# Patient Record
Sex: Female | Born: 1970 | Race: White | Hispanic: No | Marital: Married | Smoking: Never smoker
Health system: Southern US, Community
[De-identification: ages and names within clinical notes are randomized; demographics above are authoritative.]

## PROBLEM LIST (undated history)

## (undated) DIAGNOSIS — F32A Depression, unspecified: Secondary | ICD-10-CM

## (undated) DIAGNOSIS — F329 Major depressive disorder, single episode, unspecified: Secondary | ICD-10-CM

## (undated) DIAGNOSIS — N809 Endometriosis, unspecified: Secondary | ICD-10-CM

## (undated) DIAGNOSIS — Z8489 Family history of other specified conditions: Secondary | ICD-10-CM

## (undated) HISTORY — PX: BUNIONECTOMY: SHX129

## (undated) HISTORY — PX: APPENDECTOMY: SHX54

---

## 1998-05-15 ENCOUNTER — Other Ambulatory Visit: Admission: RE | Admit: 1998-05-15 | Discharge: 1998-05-15 | Payer: Self-pay | Admitting: Obstetrics and Gynecology

## 1999-09-24 DIAGNOSIS — N809 Endometriosis, unspecified: Secondary | ICD-10-CM

## 1999-09-24 HISTORY — DX: Endometriosis, unspecified: N80.9

## 1999-09-24 HISTORY — PX: PELVIC LAPAROSCOPY: SHX162

## 2008-08-24 ENCOUNTER — Ambulatory Visit: Payer: Self-pay | Admitting: Obstetrics and Gynecology

## 2009-04-13 ENCOUNTER — Ambulatory Visit: Payer: Self-pay

## 2009-04-13 ENCOUNTER — Ambulatory Visit: Payer: Self-pay | Admitting: Internal Medicine

## 2009-05-18 ENCOUNTER — Ambulatory Visit: Payer: Self-pay | Admitting: Internal Medicine

## 2009-11-18 IMAGING — CT CT CHEST W/ CM
2 series · 16 of 31 positions shown, 20 images · IV contrast (APPLIED)
Comparison: none

REASON FOR EXAM: unspecified chest pain and pressure
COMMENTS:

[Series 4: soft tissue · axial · 0.69mm/px · z∈[-730,-640]mm · 3 of 98 slices shown]
[im 8/98  mediastinal]
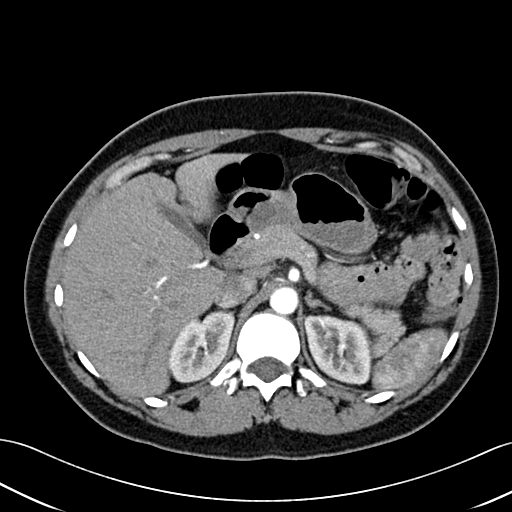
[im 23/98  mediastinal]
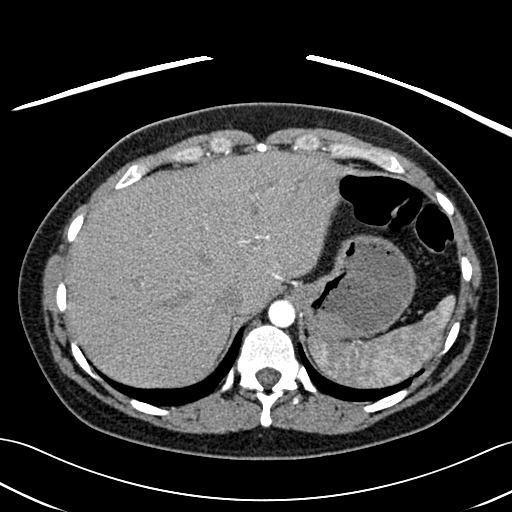
[im 38/98  mediastinal]
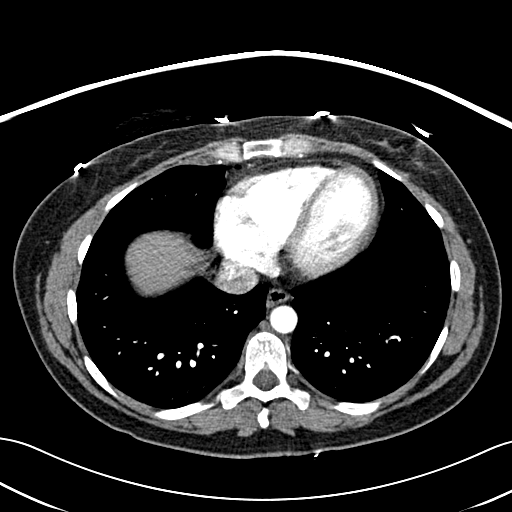

[Series 5: lung windows · axial · 0.69mm/px · z∈[-720,-484]mm · 13 of 95 slices shown, 17 images]
[im 8/95  mediastinal]
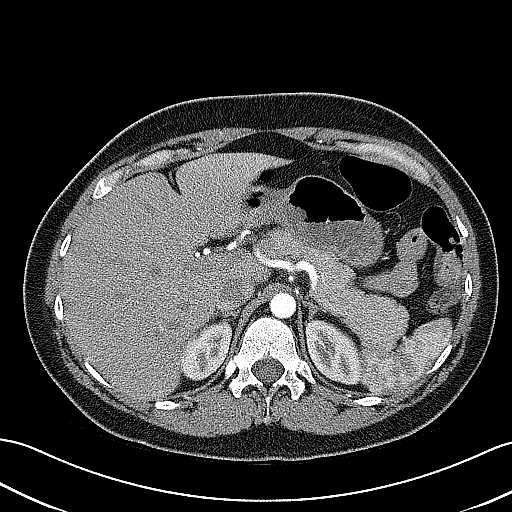
[im 8/95  lung]
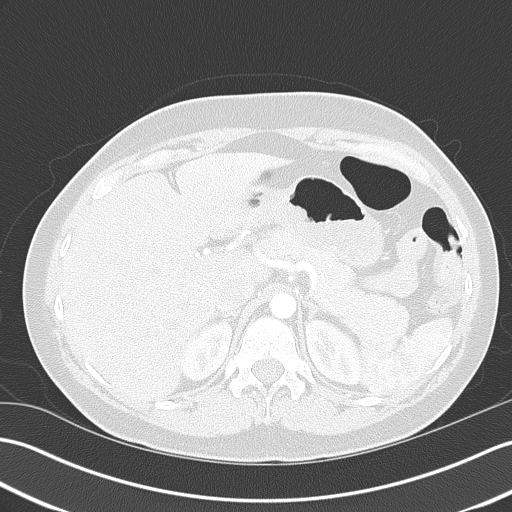
[im 15/95  lung]
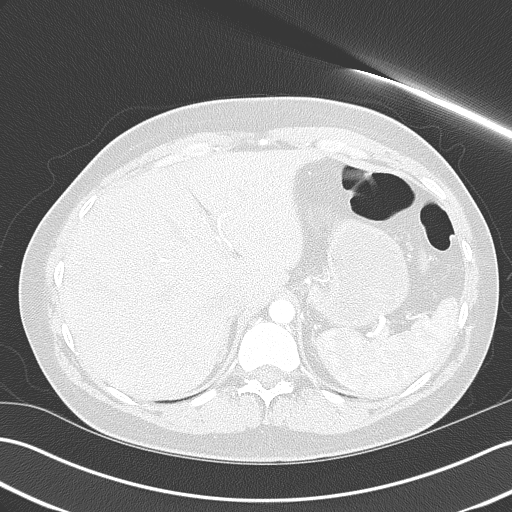
[im 22/95  lung]
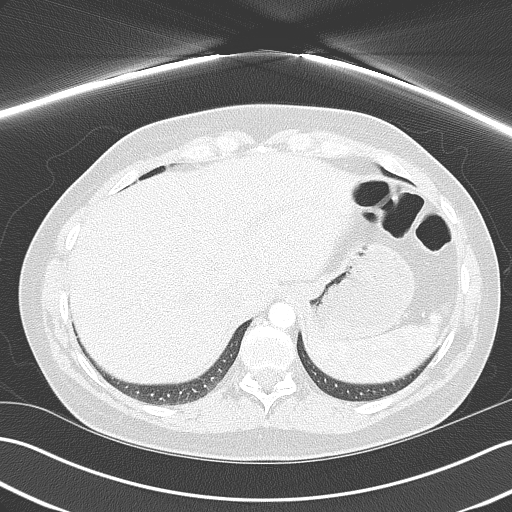
[im 29/95  lung]
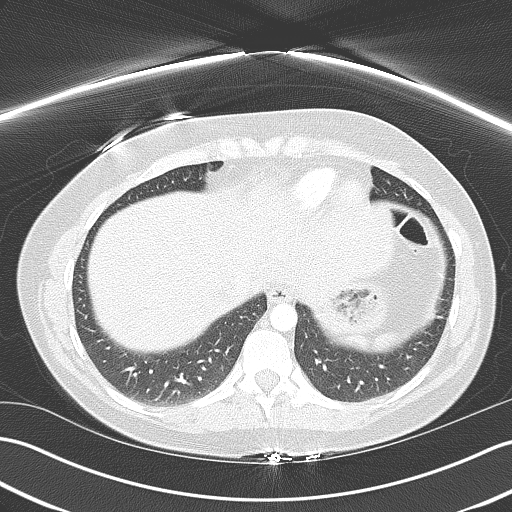
[im 37/95  mediastinal]
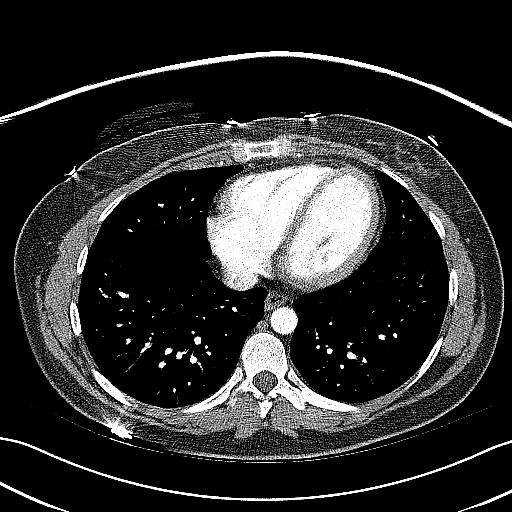
[im 37/95  lung]
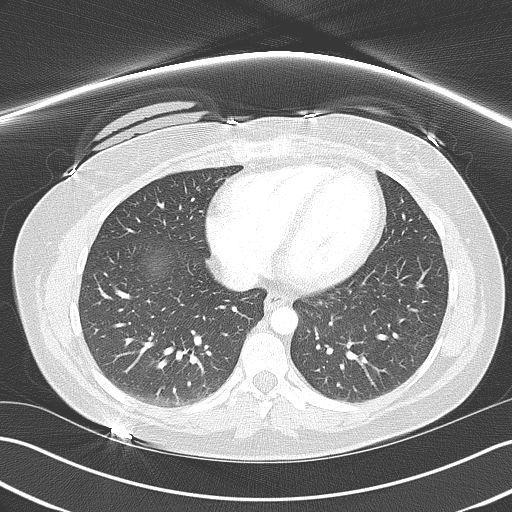
[im 44/95  lung]
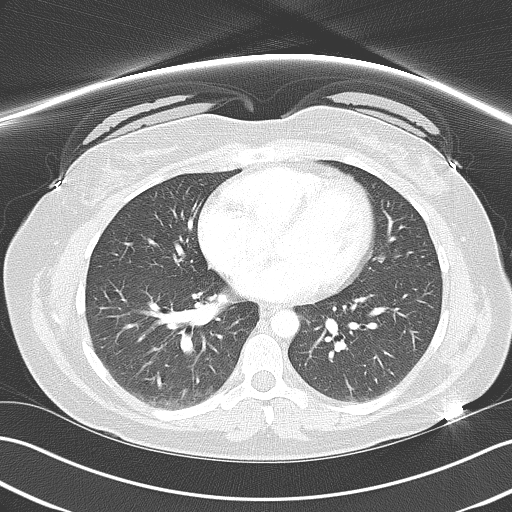
[im 48/95  lung]
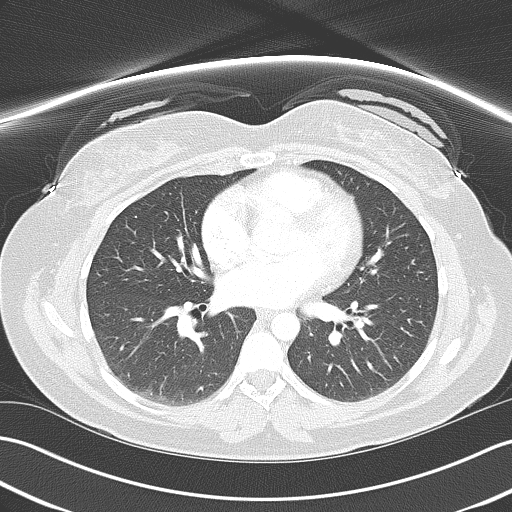
[im 51/95  lung]
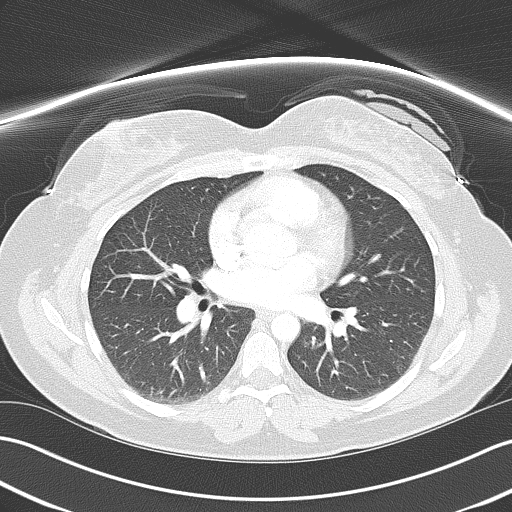
[im 58/95  mediastinal]
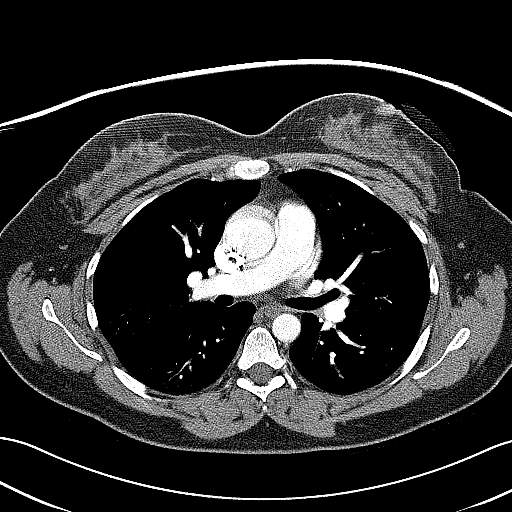
[im 58/95  lung]
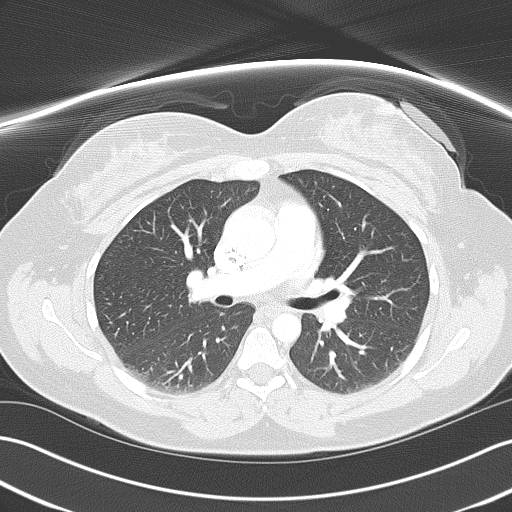
[im 66/95  lung]
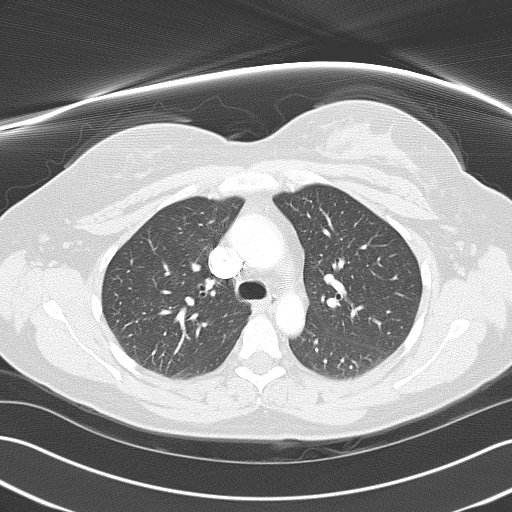
[im 73/95  lung]
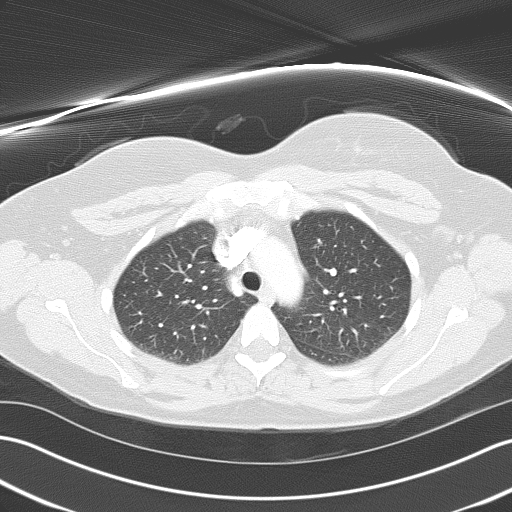
[im 80/95  lung]
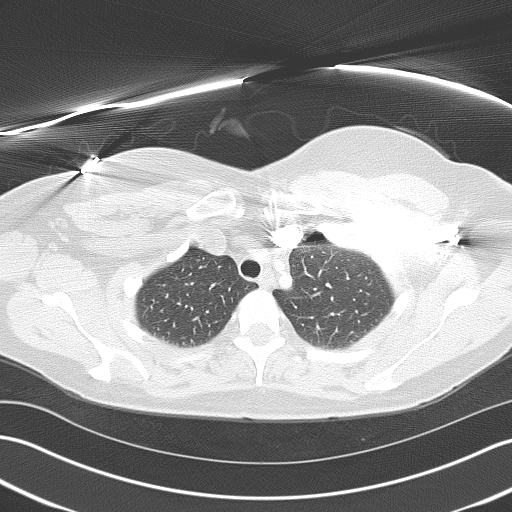
[im 87/95  mediastinal]
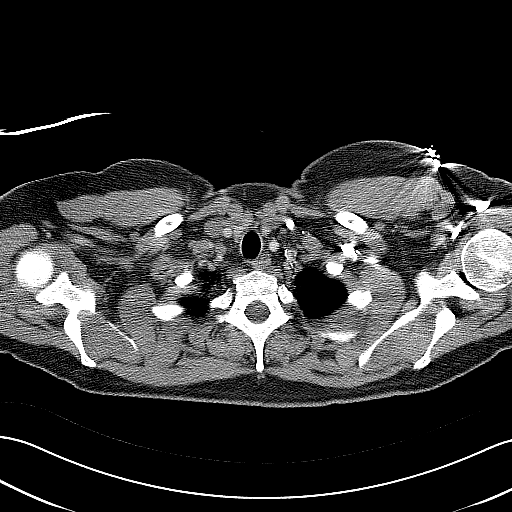
[im 87/95  lung]
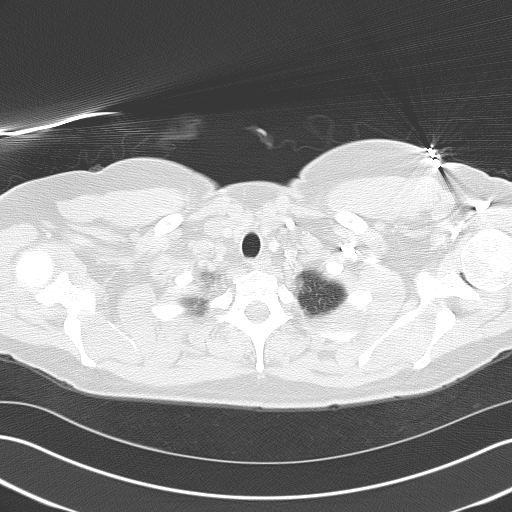

[16 of 31 positions shown; findings below may reference images not displayed]

PROCEDURE:     CT  - CT CHEST (FOR PE) W  - May 18, 2009  [DATE]

RESULT:     History: Pain.

Comparison studies: A prior Doppler exam of the lower extremities of
04/13/2009.

Procedure and Findings: IV contrast enhanced CT of the chest performed using
75 cc of Dsovue-NPO. Thoracic aorta is normal. Motion artifact of the
pulmonary arteries and aorta noted. Adrenals are normal. Pulmonary arteries
are normal. Volume averaging with fat noted along the inferior margin of the
pulmonary artery. Large airways are patent. No focal pulmonary abnormalities
identified. Webspaces utilized for evaluation of the pulmonary arteries and
aorta.
IMPRESSION: No evidence of pulmonary embolus.

## 2013-09-29 ENCOUNTER — Ambulatory Visit: Payer: Self-pay | Admitting: Obstetrics and Gynecology

## 2013-10-08 ENCOUNTER — Ambulatory Visit: Payer: Self-pay | Admitting: Obstetrics and Gynecology

## 2014-10-21 ENCOUNTER — Ambulatory Visit: Payer: Self-pay | Admitting: Obstetrics and Gynecology

## 2014-11-18 ENCOUNTER — Ambulatory Visit: Payer: Self-pay | Admitting: Neurology

## 2015-03-22 ENCOUNTER — Other Ambulatory Visit: Payer: Self-pay | Admitting: Unknown Physician Specialty

## 2015-03-22 DIAGNOSIS — M25562 Pain in left knee: Principal | ICD-10-CM

## 2015-03-22 DIAGNOSIS — G8929 Other chronic pain: Secondary | ICD-10-CM

## 2015-03-24 ENCOUNTER — Ambulatory Visit: Payer: BC Managed Care – PPO

## 2015-03-24 ENCOUNTER — Ambulatory Visit
Admission: RE | Admit: 2015-03-24 | Discharge: 2015-03-24 | Disposition: A | Discharge status: Home / Self Care | Payer: BC Managed Care – PPO | Source: Ambulatory Visit | Attending: Unknown Physician Specialty | Admitting: Unknown Physician Specialty

## 2015-03-24 DIAGNOSIS — M25562 Pain in left knee: Secondary | ICD-10-CM

## 2015-03-24 DIAGNOSIS — S83242A Other tear of medial meniscus, current injury, left knee, initial encounter: Secondary | ICD-10-CM | POA: Diagnosis not present

## 2015-03-24 DIAGNOSIS — G8929 Other chronic pain: Secondary | ICD-10-CM | POA: Diagnosis present

## 2015-04-04 ENCOUNTER — Ambulatory Visit
Admission: RE | Admit: 2015-04-04 | Discharge: 2015-04-04 | Disposition: A | Discharge status: Home / Self Care | Payer: BC Managed Care – PPO | Source: Ambulatory Visit | Attending: Surgery | Admitting: Surgery

## 2015-04-04 ENCOUNTER — Encounter
Admission: RE | Disposition: A | Discharge status: Home / Self Care | Payer: Self-pay | Source: Ambulatory Visit | Attending: Surgery

## 2015-04-04 ENCOUNTER — Ambulatory Visit: Payer: BC Managed Care – PPO | Admitting: Certified Registered Nurse Anesthetist

## 2015-04-04 ENCOUNTER — Encounter: Payer: Self-pay | Admitting: *Deleted

## 2015-04-04 DIAGNOSIS — Z800 Family history of malignant neoplasm of digestive organs: Secondary | ICD-10-CM | POA: Diagnosis not present

## 2015-04-04 DIAGNOSIS — Z885 Allergy status to narcotic agent status: Secondary | ICD-10-CM | POA: Insufficient documentation

## 2015-04-04 DIAGNOSIS — Z79899 Other long term (current) drug therapy: Secondary | ICD-10-CM | POA: Diagnosis not present

## 2015-04-04 DIAGNOSIS — Z823 Family history of stroke: Secondary | ICD-10-CM | POA: Insufficient documentation

## 2015-04-04 DIAGNOSIS — G43909 Migraine, unspecified, not intractable, without status migrainosus: Secondary | ICD-10-CM | POA: Insufficient documentation

## 2015-04-04 DIAGNOSIS — Z801 Family history of malignant neoplasm of trachea, bronchus and lung: Secondary | ICD-10-CM | POA: Insufficient documentation

## 2015-04-04 DIAGNOSIS — M23222 Derangement of posterior horn of medial meniscus due to old tear or injury, left knee: Secondary | ICD-10-CM | POA: Diagnosis present

## 2015-04-04 DIAGNOSIS — K589 Irritable bowel syndrome without diarrhea: Secondary | ICD-10-CM | POA: Insufficient documentation

## 2015-04-04 DIAGNOSIS — Z8249 Family history of ischemic heart disease and other diseases of the circulatory system: Secondary | ICD-10-CM | POA: Diagnosis not present

## 2015-04-04 DIAGNOSIS — Z803 Family history of malignant neoplasm of breast: Secondary | ICD-10-CM | POA: Diagnosis not present

## 2015-04-04 HISTORY — DX: Major depressive disorder, single episode, unspecified: F32.9

## 2015-04-04 HISTORY — PX: KNEE ARTHROSCOPY WITH MENISCAL REPAIR: SHX5653

## 2015-04-04 HISTORY — DX: Depression, unspecified: F32.A

## 2015-04-04 LAB — POCT PREGNANCY, URINE: Preg Test, Ur: NEGATIVE

## 2015-04-04 SURGERY — ARTHROSCOPY, KNEE, WITH MENISCUS REPAIR
Anesthesia: General | Laterality: Left | Wound class: Clean

## 2015-04-04 MED ORDER — POTASSIUM CHLORIDE IN NACL 20-0.9 MEQ/L-% IV SOLN
INTRAVENOUS | Status: DC
  Filled 2015-04-04 (×3): qty 1000

## 2015-04-04 MED ORDER — MIDAZOLAM HCL 2 MG/2ML IJ SOLN
INTRAMUSCULAR | Status: DC | PRN
  Administered 2015-04-04: 2 mg via INTRAVENOUS

## 2015-04-04 MED ORDER — DEXAMETHASONE SODIUM PHOSPHATE 4 MG/ML IJ SOLN
INTRAMUSCULAR | Status: DC | PRN
  Administered 2015-04-04: 4 mg via INTRAVENOUS

## 2015-04-04 MED ORDER — LIDOCAINE HCL (CARDIAC) 20 MG/ML IV SOLN
INTRAVENOUS | Status: DC | PRN
  Administered 2015-04-04: 80 mg via INTRAVENOUS

## 2015-04-04 MED ORDER — OXYCODONE HCL 5 MG PO TABS: 5.0000 mg | ORAL_TABLET | ORAL | Status: DC | PRN

## 2015-04-04 MED ORDER — KETOROLAC TROMETHAMINE 30 MG/ML IJ SOLN
INTRAMUSCULAR | Status: DC | PRN
  Administered 2015-04-04: 30 mg via INTRAVENOUS

## 2015-04-04 MED ORDER — CEFAZOLIN SODIUM-DEXTROSE 2-3 GM-% IV SOLR
2.0000 g | Freq: Once | INTRAVENOUS | Status: AC
  Administered 2015-04-04: 2 g via INTRAVENOUS

## 2015-04-04 MED ORDER — METOCLOPRAMIDE HCL 5 MG/ML IJ SOLN: 5.0000 mg | Freq: Three times a day (TID) | INTRAMUSCULAR | Status: DC | PRN

## 2015-04-04 MED ORDER — LIDOCAINE HCL 1 % IJ SOLN
INTRAMUSCULAR | Status: DC | PRN
  Administered 2015-04-04: 30 mL

## 2015-04-04 MED ORDER — BUPIVACAINE-EPINEPHRINE (PF) 0.5% -1:200000 IJ SOLN
INTRAMUSCULAR | Status: AC
  Filled 2015-04-04: qty 30

## 2015-04-04 MED ORDER — ONDANSETRON HCL 4 MG/2ML IJ SOLN: 4.0000 mg | Freq: Once | INTRAMUSCULAR | Status: DC | PRN

## 2015-04-04 MED ORDER — LACTATED RINGERS IV SOLN
INTRAVENOUS | Status: DC
  Administered 2015-04-04: 07:00:00 via INTRAVENOUS

## 2015-04-04 MED ORDER — ONDANSETRON HCL 4 MG PO TABS: 4.0000 mg | ORAL_TABLET | Freq: Four times a day (QID) | ORAL | Status: DC | PRN

## 2015-04-04 MED ORDER — ACETAMINOPHEN 10 MG/ML IV SOLN
INTRAVENOUS | Status: AC
  Filled 2015-04-04: qty 100

## 2015-04-04 MED ORDER — ONDANSETRON HCL 4 MG/2ML IJ SOLN: 4.0000 mg | Freq: Four times a day (QID) | INTRAMUSCULAR | Status: DC | PRN

## 2015-04-04 MED ORDER — PROPOFOL 10 MG/ML IV BOLUS
INTRAVENOUS | Status: DC | PRN
  Administered 2015-04-04: 200 mg via INTRAVENOUS

## 2015-04-04 MED ORDER — METOCLOPRAMIDE HCL 10 MG PO TABS: 5.0000 mg | ORAL_TABLET | Freq: Three times a day (TID) | ORAL | Status: DC | PRN

## 2015-04-04 MED ORDER — ACETAMINOPHEN 10 MG/ML IV SOLN
INTRAVENOUS | Status: DC | PRN
  Administered 2015-04-04: 1000 mg via INTRAVENOUS

## 2015-04-04 MED ORDER — BUPIVACAINE-EPINEPHRINE (PF) 0.5% -1:200000 IJ SOLN
INTRAMUSCULAR | Status: DC | PRN
  Administered 2015-04-04: 30 mL via PERINEURAL
  Administered 2015-04-04: 10 mL via PERINEURAL

## 2015-04-04 MED ORDER — CEFAZOLIN SODIUM-DEXTROSE 2-3 GM-% IV SOLR
INTRAVENOUS | Status: AC
  Filled 2015-04-04: qty 50

## 2015-04-04 MED ORDER — FENTANYL CITRATE (PF) 100 MCG/2ML IJ SOLN: 25.0000 ug | INTRAMUSCULAR | Status: DC | PRN

## 2015-04-04 MED ORDER — LIDOCAINE HCL (PF) 1 % IJ SOLN
INTRAMUSCULAR | Status: AC
  Filled 2015-04-04: qty 30

## 2015-04-04 MED ORDER — ONDANSETRON HCL 4 MG/2ML IJ SOLN
INTRAMUSCULAR | Status: DC | PRN
  Administered 2015-04-04: 4 mg via INTRAVENOUS

## 2015-04-04 MED ORDER — FENTANYL CITRATE (PF) 100 MCG/2ML IJ SOLN
INTRAMUSCULAR | Status: DC | PRN
  Administered 2015-04-04 (×4): 25 ug via INTRAVENOUS

## 2015-04-04 MED ORDER — ONDANSETRON 4 MG PO TBDP: 4.0000 mg | ORAL_TABLET | Freq: Three times a day (TID) | ORAL | Status: DC | PRN

## 2015-04-04 SURGICAL SUPPLY — 32 items
BAG COUNTER SPONGE EZ (MISCELLANEOUS) IMPLANT
BAG SPNG 4X4 CLR HAZ (MISCELLANEOUS)
BANDAGE ELASTIC 6 CLIP ST LF (GAUZE/BANDAGES/DRESSINGS) ×3 IMPLANT
BLADE FULL RADIUS 3.5 (BLADE) ×3 IMPLANT
BLADE SHAVER 4.5X7 STR FR (MISCELLANEOUS) ×3 IMPLANT
CHLORAPREP W/TINT 26ML (MISCELLANEOUS) ×3 IMPLANT
COUNTER SPONGE BAG EZ (MISCELLANEOUS)
DECANTER SPIKE VIAL GLASS SM (MISCELLANEOUS) ×4 IMPLANT
DRAPE SHEET LG 3/4 BI-LAMINATE (DRAPES) ×2 IMPLANT
GAUZE SPONGE 4X4 12PLY STRL (GAUZE/BANDAGES/DRESSINGS) ×3 IMPLANT
GLOVE BIO SURGEON STRL SZ8 (GLOVE) ×6 IMPLANT
GLOVE BIOGEL M 7.0 STRL (GLOVE) ×6 IMPLANT
GLOVE BIOGEL PI IND STRL 7.5 (GLOVE) ×1 IMPLANT
GLOVE BIOGEL PI INDICATOR 7.5 (GLOVE) ×2
GLOVE INDICATOR 8.0 STRL GRN (GLOVE) ×3 IMPLANT
GOWN STRL REUS W/ TWL LRG LVL3 (GOWN DISPOSABLE) ×1 IMPLANT
GOWN STRL REUS W/ TWL XL LVL3 (GOWN DISPOSABLE) ×2 IMPLANT
GOWN STRL REUS W/TWL LRG LVL3 (GOWN DISPOSABLE) ×3
GOWN STRL REUS W/TWL XL LVL3 (GOWN DISPOSABLE) ×6
IV LACTATED RINGER IRRG 3000ML (IV SOLUTION) ×3
IV LR IRRIG 3000ML ARTHROMATIC (IV SOLUTION) ×1 IMPLANT
KIT RM TURNOVER STRD PROC AR (KITS) ×3 IMPLANT
MANIFOLD NEPTUNE II (INSTRUMENTS) ×3 IMPLANT
NDL HYPO 21X1.5 SAFETY (NEEDLE) ×1 IMPLANT
NEEDLE HYPO 21X1.5 SAFETY (NEEDLE) ×3 IMPLANT
PACK ARTHROSCOPY KNEE (MISCELLANEOUS) ×3 IMPLANT
PAD GROUND ADULT SPLIT (MISCELLANEOUS) ×3 IMPLANT
PENCIL ELECTRO HAND CTR (MISCELLANEOUS) ×3 IMPLANT
SUT PROLENE 4 0 PS 2 18 (SUTURE) ×3 IMPLANT
SYR 50ML LL SCALE MARK (SYRINGE) ×1 IMPLANT
TUBING ARTHRO INFLOW-ONLY STRL (TUBING) ×3 IMPLANT
WAND HAND CNTRL MULTIVAC 90 (MISCELLANEOUS) ×3 IMPLANT

## 2015-04-04 NOTE — H&P (Signed)
Paper H&P to be scanned into permanent record. H&P reviewed. No changes. 

## 2015-04-04 NOTE — Anesthesia Postprocedure Evaluation (Signed)
  Anesthesia Post-op Note  Patient: Samantha Cunningham  Procedure(s) Performed: Procedure(s): KNEE ARTHROSCOPY WITH partial medial menisectomy (Left)  Anesthesia type:General  Patient location: PACU  Post pain: Pain level controlled  Post assessment: Post-op Vital signs reviewed, Patient's Cardiovascular Status Stable, Respiratory Function Stable, Patent Airway and No signs of Nausea or vomiting  Post vital signs: Reviewed and stable  Last Vitals:  Filed Vitals:   04/04/15 0946  BP: 127/77  Pulse: 77  Temp: 36.4 C  Resp: 16    Level of consciousness: awake, alert  and patient cooperative  Complications: No apparent anesthesia complications

## 2015-04-04 NOTE — Anesthesia Preprocedure Evaluation (Addendum)
Anesthesia Evaluation   Patient awake    Reviewed: Allergy & Precautions, NPO status , Patient's Chart, lab work & pertinent test results  Airway Mallampati: II       Dental  (+) Chipped   Pulmonary neg pulmonary ROS,    Pulmonary exam normal       Cardiovascular negative cardio ROS Normal cardiovascular exam    Neuro/Psych Depression negative neurological ROS     GI/Hepatic negative GI ROS, Neg liver ROS,   Endo/Other  negative endocrine ROS  Renal/GU negative Renal ROS  negative genitourinary   Musculoskeletal Knee derangement   Abdominal Normal abdominal exam  (+) + obese,   Peds negative pediatric ROS (+)  Hematology negative hematology ROS (+)   Anesthesia Other Findings Patient has issues with Fentanyl patch  Reproductive/Obstetrics                            Anesthesia Physical Anesthesia Plan  ASA: II  Anesthesia Plan: General   Post-op Pain Management:    Induction: Intravenous  Airway Management Planned: LMA  Additional Equipment:   Intra-op Plan:   Post-operative Plan:   Informed Consent: I have reviewed the patients History and Physical, chart, labs and discussed the procedure including the risks, benefits and alternatives for the proposed anesthesia with the patient or authorized representative who has indicated his/her understanding and acceptance.   Dental advisory given  Plan Discussed with: CRNA and Surgeon  Anesthesia Plan Comments:         Anesthesia Quick Evaluation

## 2015-04-04 NOTE — Transfer of Care (Signed)
Immediate Anesthesia Transfer of Care Note  Patient: Samantha Cunningham  Procedure(s) Performed: Procedure(s): KNEE ARTHROSCOPY WITH partial medial menisectomy (Left)  Patient Location: PACU  Anesthesia Type:General  Level of Consciousness: awake, alert  and oriented  Airway & Oxygen Therapy: Patient Spontanous Breathing and Patient connected to face mask oxygen  Post-op Assessment: Report given to RN and Post -op Vital signs reviewed and stable  Post vital signs: stable  Last Vitals:  Filed Vitals:   04/04/15 0846  BP: 129/86  Pulse: 95  Temp: 36.9 C  Resp: 13    Complications: No apparent anesthesia complications

## 2015-04-04 NOTE — Discharge Instructions (Addendum)
Keep dressing dry and intact.  May shower after dressing changed on post-op day #4 (Saturday).  Cover staples/sutures with Band-Aids after drying off. Apply ice frequently to knee. May weight-bear as tolerated - use crutches or walker as needed. Follow-up in 10-14 days or as scheduled  .AMBULATORY SURGERY  DISCHARGE INSTRUCTIONS   1) The drugs that you were given will stay in your system until tomorrow so for the next 24 hours you should not:  A) Drive an automobile B) Make any legal decisions C) Drink any alcoholic beverage   2) You may resume regular meals tomorrow.  Today it is better to start with liquids and gradually work up to solid foods.  You may eat anything you prefer, but it is better to start with liquids, then soup and crackers, and gradually work up to solid foods.   3) Please notify your doctor immediately if you have any unusual bleeding, trouble breathing, redness and pain at the surgery site, drainage, fever, or pain not relieved by medication.    4) Additional Instructions:        Please contact your physician with any problems or Same Day Surgery at [phone], Monday through Friday 6 am to 4 pm, or [facility] Main number at [phone].

## 2015-04-04 NOTE — Op Note (Signed)
04/04/2015  8:40 AM  Patient:   Samantha Cunningham  Pre-Op Diagnosis:   Medial meniscus tear, left knee.  Postoperative diagnosis:   Same  Procedure:   Arthroscopic partial medial meniscectomy, left knee.  Surgeon:   Maryagnes AmosJ. Jeffrey Poggi, M.D.  Anesthesia:   General LMA.  Findings:   As above. The lateral meniscus was in satisfactory condition, as were the anterior and posterior cruciate ligaments. The articular surfaces of the patella, the femur, and the tibia all were in satisfactory condition.  Complications:   None.  EBL:   5 cc.  Total fluids:   700 cc of crystalloid.  Tourniquet time:   None  Drains:   None  Closure:   4-0 Prolene interrupted sutures.  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 Her symptoms have persisted despite medications, activity modification, etc. Her history and examination are consistent with a medial meniscus tear, confirmed by MRI scan. The patient presents at this time for arthroscopy, debridement, and partial medial meniscectomy.  Procedure:   The patient was brought into the operating room and lain in the supine position. After adequate general laryngal mask anesthesia was obtained, a timeout was performed to verify the appropriate side. The patient's left knee was injected sterilely using a solution of 30 cc of 1% lidocaine and 30 cc of 0.5% Sensorcaine with epinephrine. The left lower extremity was prepped with DuraPrep solution before being draped sterilely. Preoperative antibiotics were administered. The expected portal sites were injected with 0.5% Sensorcaine with epinephrine before the camera was placed in the anterolateral portal and instrumentation performed through the anteromedial portal. The knee was sequentially examined beginning in the suprapatellar pouch the progressing to the patellofemoral space, the  medial gutter compartment, the notch, and finally the lateral compartment entered. The findings were as described above. Abundant reactive synovial tissues anteriorly were debrided using the full-radius resector in order to improve visualization. Assessment of the medial meniscus tear showed that it was a complex tear involving the posterior medial portion. Much of the tearing was in the white-white zone and therefore deemed not repairable. The torn portions were debrided back to stable margins using straight and up-biting mini-munchers was as well as the 3.5 mm full-radius resector. Some probing of the remaining rim demonstrated excellent stability. Laterally, the meniscus was intact to probing. Areas of bleeding were cauterized using the ArthroCare wand. The instruments were removed from the joint after suctioning the excess fluid. The portal sites were closed using 4-0 Prolene interrupted sutures before a sterile bulky dressing was applied to the knee. The patient was then awakened, extubated, and returned to the recovery room in satisfactory condition after tolerating the procedure well.

## 2015-07-27 ENCOUNTER — Other Ambulatory Visit: Payer: Self-pay | Admitting: Obstetrics and Gynecology

## 2015-07-27 DIAGNOSIS — N6312 Unspecified lump in the right breast, upper inner quadrant: Secondary | ICD-10-CM

## 2015-08-02 ENCOUNTER — Ambulatory Visit
Admission: RE | Admit: 2015-08-02 | Discharge: 2015-08-02 | Disposition: A | Discharge status: Home / Self Care | Payer: BC Managed Care – PPO | Source: Ambulatory Visit | Attending: Obstetrics and Gynecology | Admitting: Obstetrics and Gynecology

## 2015-08-02 ENCOUNTER — Other Ambulatory Visit: Payer: Self-pay | Admitting: Obstetrics and Gynecology

## 2015-08-02 DIAGNOSIS — N6001 Solitary cyst of right breast: Secondary | ICD-10-CM | POA: Diagnosis not present

## 2015-08-02 DIAGNOSIS — N6312 Unspecified lump in the right breast, upper inner quadrant: Secondary | ICD-10-CM

## 2015-08-02 DIAGNOSIS — N63 Unspecified lump in breast: Secondary | ICD-10-CM | POA: Diagnosis present

## 2015-08-04 ENCOUNTER — Ambulatory Visit: Payer: BC Managed Care – PPO

## 2015-08-04 ENCOUNTER — Other Ambulatory Visit: Payer: BC Managed Care – PPO

## 2015-08-11 ENCOUNTER — Ambulatory Visit: Payer: BC Managed Care – PPO

## 2015-08-11 ENCOUNTER — Other Ambulatory Visit: Payer: BC Managed Care – PPO

## 2016-01-17 ENCOUNTER — Other Ambulatory Visit: Payer: Self-pay | Admitting: Obstetrics and Gynecology

## 2016-01-17 DIAGNOSIS — Z1231 Encounter for screening mammogram for malignant neoplasm of breast: Secondary | ICD-10-CM

## 2016-01-25 ENCOUNTER — Ambulatory Visit
Admission: RE | Admit: 2016-01-25 | Discharge: 2016-01-25 | Disposition: A | Discharge status: Home / Self Care | Payer: BC Managed Care – PPO | Source: Ambulatory Visit | Attending: Obstetrics and Gynecology | Admitting: Obstetrics and Gynecology

## 2016-01-25 DIAGNOSIS — Z1231 Encounter for screening mammogram for malignant neoplasm of breast: Secondary | ICD-10-CM | POA: Diagnosis present

## 2016-01-31 ENCOUNTER — Other Ambulatory Visit: Payer: Self-pay | Admitting: Obstetrics and Gynecology

## 2016-01-31 DIAGNOSIS — R922 Inconclusive mammogram: Secondary | ICD-10-CM

## 2016-02-07 ENCOUNTER — Ambulatory Visit
Admission: RE | Admit: 2016-02-07 | Discharge: 2016-02-07 | Disposition: A | Discharge status: Home / Self Care | Payer: BC Managed Care – PPO | Source: Ambulatory Visit | Attending: Obstetrics and Gynecology | Admitting: Obstetrics and Gynecology

## 2016-02-07 DIAGNOSIS — R922 Inconclusive mammogram: Secondary | ICD-10-CM | POA: Insufficient documentation

## 2016-07-27 IMAGING — US US BREAST LTD UNI RIGHT INC AXILLA
1 series · 6 of 6 positions shown · non-contrast
Comparison: Previous exam(s).

CLINICAL DATA: Palpable lump right breast

EXAM:
DIGITAL DIAGNOSTIC RIGHT MAMMOGRAM WITH 3D TOMOSYNTHESIS WITH CAD
ULTRASOUND RIGHT BREAST

[Series 1: us breast ltd uni right inc axilla · 0.08mm/px · 6 of 6 slices shown]
[im 1/6]
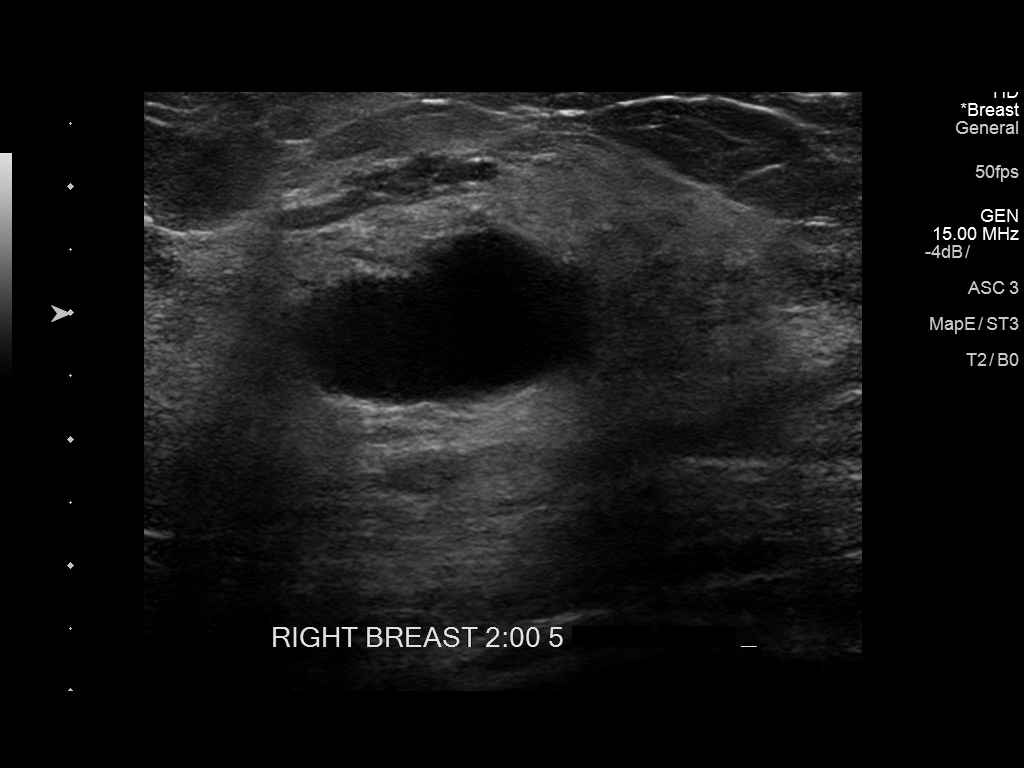
[im 2/6]
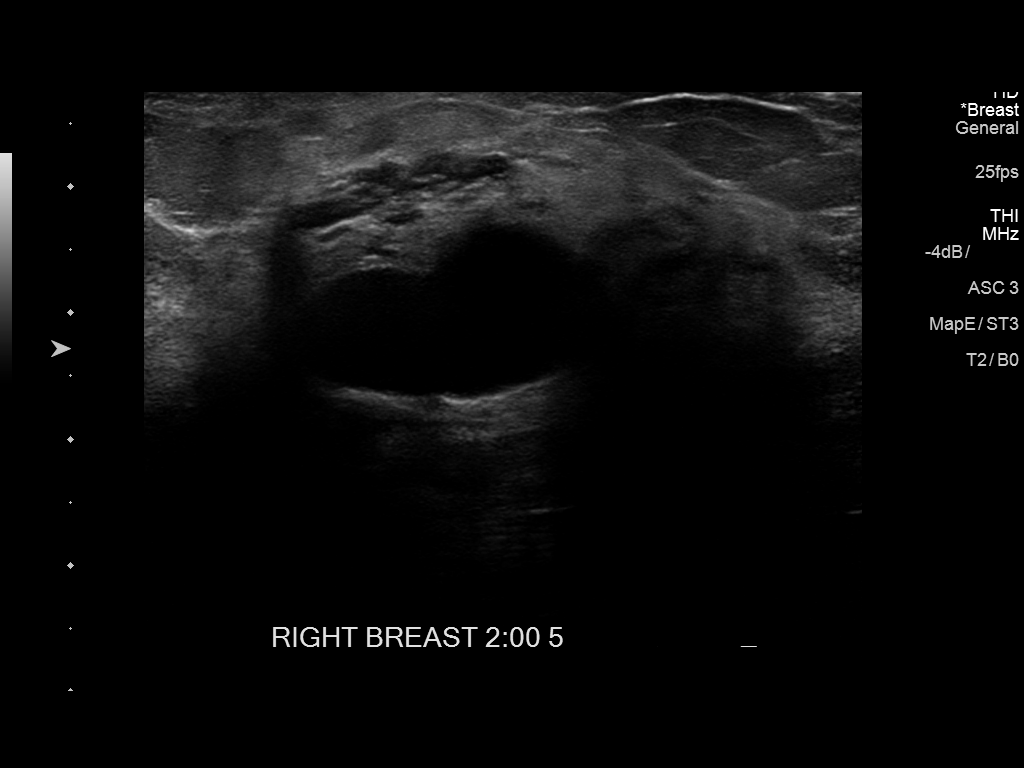
[im 3/6]
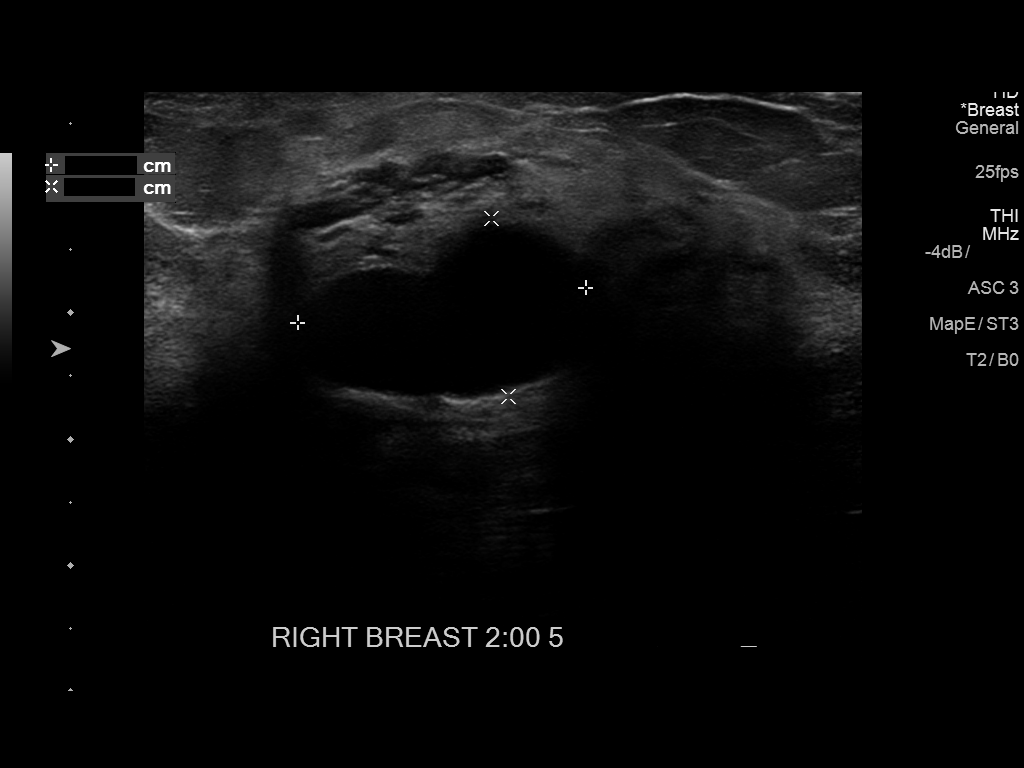
[im 4/6]
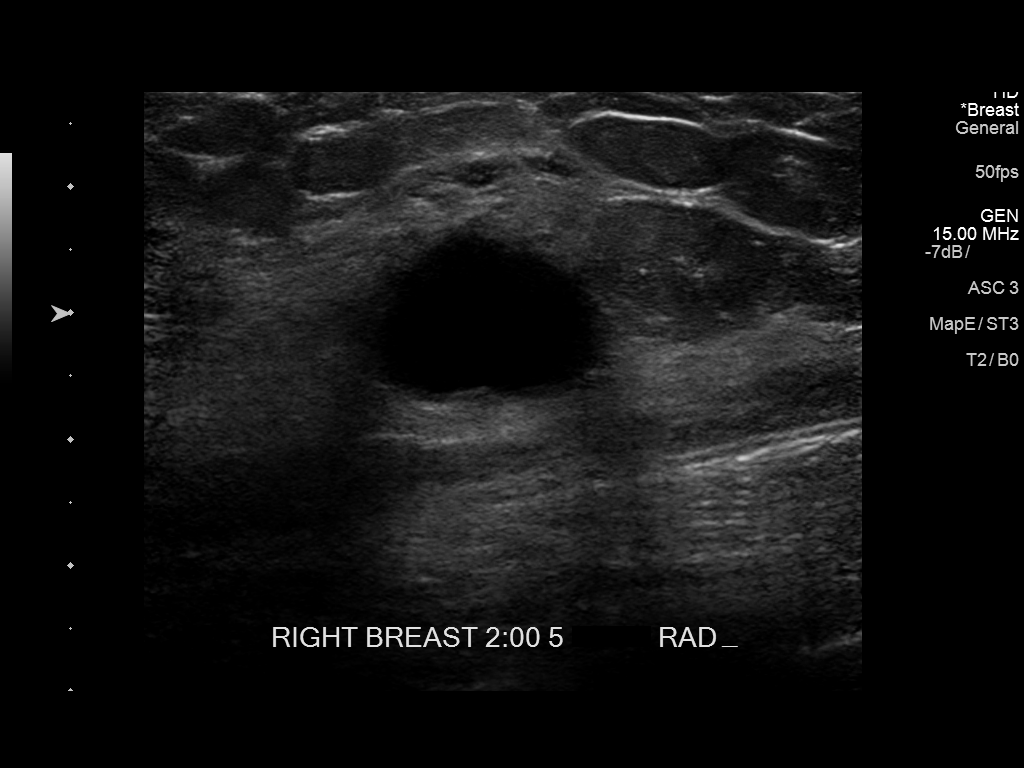
[im 5/6]
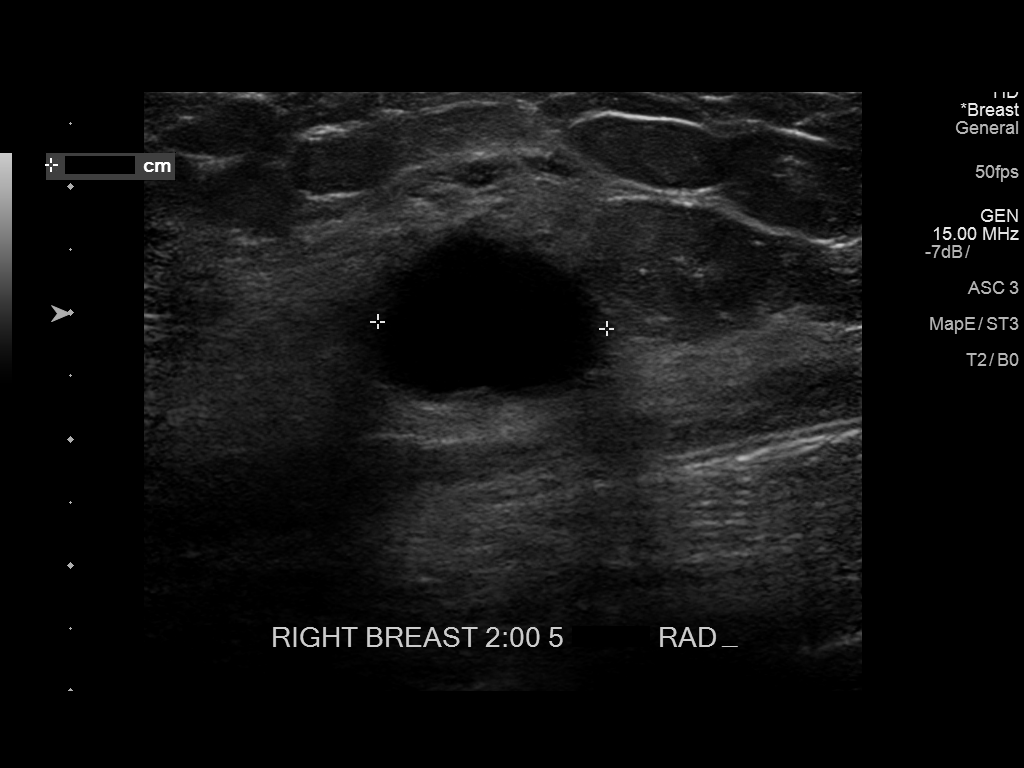
[im 6/6]
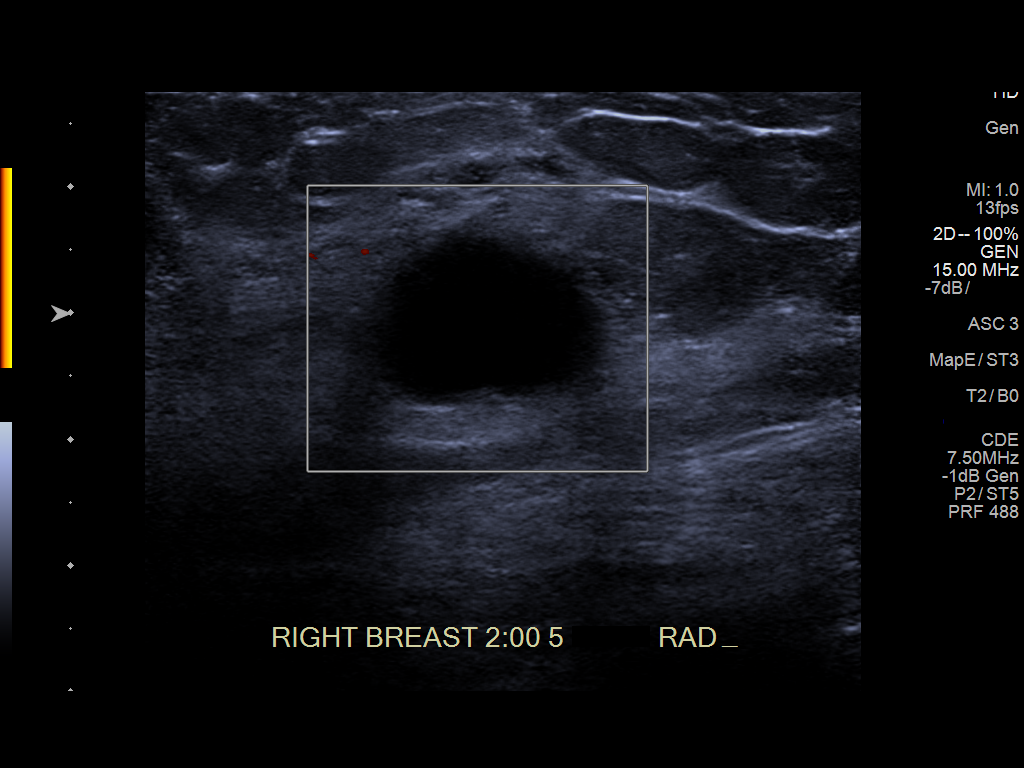

[6 of 6 positions shown; findings below may reference images not displayed]

ACR Breast Density Category c: The breast tissue is heterogeneously
dense, which may obscure small masses.
FINDINGS: Cc and MLO views, spot tangential view of the right breast are
submitted. There is a 2 cm mass in the palpable area medial slightly
upper right breast.

Mammographic images were processed with CAD.

Targeted ultrasound is performed, showing a 2.3 x 1.4 x 1.8 cm
simple cyst at the right breast 2 o'clock 5 cm from nipple palpable
area correlating to the mammographic finding.
IMPRESSION: Benign findings.

RECOMMENDATION:
Routine screening mammogram back on schedule.

I have discussed the findings and recommendations with the patient.
Results were also provided in writing at the conclusion of the
visit. If applicable, a reminder letter will be sent to the patient
regarding the next appointment.

BI-RADS CATEGORY  2: Benign.

## 2017-01-10 ENCOUNTER — Other Ambulatory Visit: Payer: Self-pay | Admitting: Obstetrics and Gynecology

## 2017-01-10 DIAGNOSIS — Z1231 Encounter for screening mammogram for malignant neoplasm of breast: Secondary | ICD-10-CM

## 2017-02-10 ENCOUNTER — Ambulatory Visit
Admission: RE | Admit: 2017-02-10 | Discharge: 2017-02-10 | Disposition: A | Discharge status: Home / Self Care | Payer: BC Managed Care – PPO | Source: Ambulatory Visit | Attending: Obstetrics and Gynecology | Admitting: Obstetrics and Gynecology

## 2017-02-10 DIAGNOSIS — Z1231 Encounter for screening mammogram for malignant neoplasm of breast: Secondary | ICD-10-CM | POA: Diagnosis present

## 2018-09-24 NOTE — H&P (Signed)
Patient ID: Samantha Cunningham is a 48 y.o. 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 Family History: family history includes Breast cancer in her maternal grandmother; Cancer in her mother, paternal grandfather, paternal uncle, and paternal uncle; Deep vein thrombosis (DVT or abnormal blood clot formation) in her brother and sister; Lung cancer in her mother; Migraines in her mother; Skin cancer in her father; Stroke in her brother. Social History:  reports that she has never smoked. She has never used smokeless tobacco. She reports that she does not drink alcohol or use drugs. OB/GYN History:          OB History    Gravida  1   Para  1   Term  1   Preterm      AB      Living  1     SAB      TAB      Ectopic      Molar      Multiple      Live Births             Allergies: is allergic to hydrocodone; loperamide; and fentanyl  citrate. Medications:  Current Outpatient Medications:  .  escitalopram oxalate (LEXAPRO) 10 MG tablet, TAKE 1 TABLET BY MOUTH EVERY DAY, Disp: 90 tablet, Rfl: 2 .  levonorgestrel (MIRENA) 20 mcg/24 hr (5 years) IUD, Insert into the uterus., Disp: , Rfl:    Review of Systems: No SOB, no palpitations or chest pain, no new lower extremity edema, no nausea or vomiting or bowel or bladder complaints. See HPI for gyn specific ROS.   Exam:   BP 136/88   Ht 165.1 cm (5\' 5" )   Wt (!) 105.7 kg (233 lb)   BMI 38.77 kg/m   General: Patient is well-groomed, well-nourished, appears stated age in no acute distress  HEENT: head is atraumatic and normocephalic, trachea is midline, neck is supple with no palpable nodules  CV: Regular rhythm and normal heart rate, no murmur  Pulm: Clear to auscultation throughout lung fields with no wheezing, crackles, or rhonchi. No increased work of breathing  Abdomen: soft , no mass, non-tender, no rebound tenderness, no hepatomegaly  Pelvic: tanner stage 5 ,              External genitalia: vulva /labia no lesions             Urethra: no prolapse             Vagina: normal physiologic d/c, laxity in vaginal  walls             Cervix: no lesions, no cervical motion tenderness, good descent             Uterus: normal size shape and contour, non-tender             Adnexa: no mass,  non-tender               Rectovaginal: External wnl Endometrial biopsy: The cervix was cleaned with betadine, topical Hurriciane spray applied, and a single tooth tenaculum is applied to the anterior cervix. The Pipelle catheter was placed into the endometrial cavity. It sounds to 6 cm and adequate tissue was removed.  IUD REMOVAL:  No string visible, will leave in situ  . Impression:   The primary encounter diagnosis was Pelvic pain in female. Diagnoses of Encounter for IUD removal and Abnormal uterine bleeding (AUB), unspecified were also pertinent to this  visit.    Plan:    Patient returns for a  discussion regarding her persistent abnormal pap smears with hx of endometriosis. She requests to proceed with surgical treatment of her abnormal pap smears with hx of endometriosis and AUB by total laparoscopic hysterectomy with bilateral salpingectomy  procedure.She is aware that I recommend a colposcopy prior to the procedure.   We will perform a cystoscopy to evaluate the urinary tract after the procedure.   F/u EMBx. IUD remains in situ, and will come out with uterus.  She declines colposcopy at this time.  The patient and I discussed the technical aspects of the procedure including the potential for risks and complications. These include but are not limited to the risk of infection requiring post-operative antibiotics or further procedures. We talked about the risk of injury to adjacent organs including bladder, bowel, ureter, blood vessels or nerves. We talked about the need to convert to an open incision. We talked about the possible need for blood transfusion. We talked aboutpostop complications such asthromboembolic or cardiopulmonary complications. All of her questions were answered.  Her preoperative exam was completed and the appropriate consents were signed. She is scheduled to undergo this procedure in the near future.  Specific Peri-operative Considerations:  - Consent: obtained today - Health Maintenance: up todate- has seen PCP today - Labs: CBC, CMP preoperatively - Studies: EKG, CXR preoperatively - Bowel Preparation: None required - Abx:  Ancef 2g - VTE ppx: SCDs perioperatively - Glucose Protocol: n/an - Beta-blockade: n/a

## 2018-09-25 ENCOUNTER — Other Ambulatory Visit: Payer: Self-pay

## 2018-09-25 ENCOUNTER — Encounter
Admission: RE | Admit: 2018-09-25 | Discharge: 2018-09-25 | Disposition: A | Discharge status: Home / Self Care | Payer: BC Managed Care – PPO | Source: Ambulatory Visit | Attending: Obstetrics and Gynecology | Admitting: Obstetrics and Gynecology

## 2018-09-25 DIAGNOSIS — Z01818 Encounter for other preprocedural examination: Secondary | ICD-10-CM | POA: Insufficient documentation

## 2018-09-25 HISTORY — DX: Family history of other specified conditions: Z84.89

## 2018-09-25 HISTORY — DX: Endometriosis, unspecified: N80.9

## 2018-09-25 LAB — CBC
HCT: 44.9 % (ref 36.0–46.0)
Hemoglobin: 14.4 g/dL (ref 12.0–15.0)
MCH: 27.9 pg (ref 26.0–34.0)
MCHC: 32.1 g/dL (ref 30.0–36.0)
MCV: 86.8 fL (ref 80.0–100.0)
NRBC: 0 % (ref 0.0–0.2)
Platelets: 313 10*3/uL (ref 150–400)
RBC: 5.17 MIL/uL — ABNORMAL HIGH (ref 3.87–5.11)
RDW: 13.4 % (ref 11.5–15.5)
WBC: 7.4 10*3/uL (ref 4.0–10.5)

## 2018-09-25 LAB — TYPE AND SCREEN
ABO/RH(D): O POS
Antibody Screen: NEGATIVE

## 2018-09-25 LAB — BASIC METABOLIC PANEL
Anion gap: 8 (ref 5–15)
BUN: 12 mg/dL (ref 6–20)
CO2: 25 mmol/L (ref 22–32)
CREATININE: 0.79 mg/dL (ref 0.44–1.00)
Calcium: 9.1 mg/dL (ref 8.9–10.3)
Chloride: 104 mmol/L (ref 98–111)
GFR calc non Af Amer: >60 mL/min (ref >60)
Glucose, Bld: 81 mg/dL (ref 70–99)
Potassium: 3.9 mmol/L (ref 3.5–5.1)
SODIUM: 137 mmol/L (ref 135–145)

## 2018-09-25 MED ORDER — ENSURE PRE-SURGERY PO LIQD
296.0000 mL | Freq: Once | ORAL | Status: DC
  Filled 2018-09-25: qty 296

## 2018-09-25 MED ORDER — ENSURE PRE-SURGERY PO LIQD
592.0000 mL | Freq: Once | ORAL | Status: DC
  Filled 2018-09-25: qty 592

## 2018-09-25 NOTE — Patient Instructions (Signed)
Your procedure is scheduled on: Friday, October 09, 2018 Report to Day Surgery on the 2nd floor of the [facility] To find out your arrival time, please call [phone] between 1PM - 3PM on: Thursday, October 08, 2018  REMEMBER: Instructions that are not followed completely may result in serious medical risk, up to and including death; or upon the discretion of your surgeon and anesthesiologist your surgery may need to be rescheduled.  Do not eat food after midnight the night before surgery.  No gum chewing, lozengers or hard candies.  You may however, drink CLEAR liquids up to 3 hours before you are scheduled to arrive for your surgery.   ENSURE PRE-SURGERY CARBOHYDRATE DRINK:  Drink 2 bottles the night before surgery and one bottle the morning of surgery. Complete drinking the last bottle 3 hours before you arrive to the hospital.   No Alcohol for 24 hours before or after surgery.  No Smoking including e-cigarettes for 24 hours prior to surgery.  No chewable tobacco products for at least 6 hours prior to surgery.  No nicotine patches on the day of surgery.  On the morning of surgery brush your teeth with toothpaste and water, you may rinse your mouth with mouthwash if you wish. Do not swallow any toothpaste or mouthwash.  Notify your doctor if there is any change in your medical condition (cold, fever, infection).  Do not wear jewelry, make-up, hairpins, clips or nail polish.  Do not wear lotions, powders, or perfumes.   Do not shave 48 hours prior to surgery.   Contacts and dentures may not be worn into surgery.  Do not bring valuables to the hospital, including drivers license, insurance or credit cards.  [facility] is not responsible for any belongings or valuables.   TAKE THESE MEDICATIONS THE MORNING OF SURGERY:  1.  lexapro  Use CHG Soap as directed on instruction sheet.  On January 10 - Stop Anti-inflammatories (NSAIDS) such as Advil, Aleve, Ibuprofen,  Motrin, Naproxen, Naprosyn and Aspirin based products such as Excedrin, Goodys Powder, BC Powder. (May take Tylenol or Acetaminophen if needed.)  On January 10 - Stop ANY OVER THE COUNTER supplements until after surgery. (May continue multivitamin.)  Wear comfortable clothing (specific to your surgery type) to the hospital.  Plan for stool softeners for home use.  If you are being admitted to the hospital overnight, leave your suitcase in the car. After surgery it may be brought to your room.  If you are being discharged the day of surgery, you will not be allowed to drive home. You will need a responsible adult to drive you home and stay with you that night.   If you are taking public transportation, you will need to have a responsible adult with you. Please confirm with your physician that it is acceptable to use public transportation.   Please call [phone] if you have any questions about these instructions.

## 2018-10-06 ENCOUNTER — Other Ambulatory Visit: Payer: Self-pay | Admitting: Obstetrics and Gynecology

## 2018-10-08 MED ORDER — CEFAZOLIN SODIUM-DEXTROSE 2-4 GM/100ML-% IV SOLN
2.0000 g | INTRAVENOUS | Status: AC
  Administered 2018-10-09: 10 g via INTRAVENOUS
  Administered 2018-10-09: 2 g via INTRAVENOUS

## 2018-10-09 ENCOUNTER — Ambulatory Visit: Payer: BC Managed Care – PPO | Admitting: Anesthesiology

## 2018-10-09 ENCOUNTER — Other Ambulatory Visit: Payer: Self-pay

## 2018-10-09 ENCOUNTER — Ambulatory Visit
Admission: RE | Admit: 2018-10-09 | Discharge: 2018-10-09 | Disposition: A | Discharge status: Home / Self Care | Payer: BC Managed Care – PPO | Attending: Obstetrics and Gynecology | Admitting: Obstetrics and Gynecology

## 2018-10-09 ENCOUNTER — Encounter
Admission: RE | Disposition: A | Discharge status: Home / Self Care | Payer: Self-pay | Source: Home / Self Care | Attending: Obstetrics and Gynecology

## 2018-10-09 ENCOUNTER — Encounter: Payer: Self-pay | Admitting: *Deleted

## 2018-10-09 DIAGNOSIS — Z803 Family history of malignant neoplasm of breast: Secondary | ICD-10-CM | POA: Diagnosis not present

## 2018-10-09 DIAGNOSIS — R110 Nausea: Secondary | ICD-10-CM | POA: Diagnosis not present

## 2018-10-09 DIAGNOSIS — Z793 Long term (current) use of hormonal contraceptives: Secondary | ICD-10-CM | POA: Diagnosis not present

## 2018-10-09 DIAGNOSIS — D060 Carcinoma in situ of endocervix: Secondary | ICD-10-CM | POA: Diagnosis not present

## 2018-10-09 DIAGNOSIS — Z975 Presence of (intrauterine) contraceptive device: Secondary | ICD-10-CM | POA: Insufficient documentation

## 2018-10-09 DIAGNOSIS — Z6839 Body mass index (BMI) 39.0-39.9, adult: Secondary | ICD-10-CM | POA: Diagnosis not present

## 2018-10-09 DIAGNOSIS — F329 Major depressive disorder, single episode, unspecified: Secondary | ICD-10-CM | POA: Diagnosis not present

## 2018-10-09 DIAGNOSIS — Z79899 Other long term (current) drug therapy: Secondary | ICD-10-CM | POA: Insufficient documentation

## 2018-10-09 DIAGNOSIS — R87619 Unspecified abnormal cytological findings in specimens from cervix uteri: Secondary | ICD-10-CM | POA: Diagnosis present

## 2018-10-09 DIAGNOSIS — N800 Endometriosis of uterus: Secondary | ICD-10-CM | POA: Diagnosis not present

## 2018-10-09 DIAGNOSIS — N888 Other specified noninflammatory disorders of cervix uteri: Secondary | ICD-10-CM | POA: Diagnosis not present

## 2018-10-09 HISTORY — PX: LAPAROSCOPIC BILATERAL SALPINGECTOMY: SHX5889

## 2018-10-09 HISTORY — PX: LAPAROSCOPIC HYSTERECTOMY: SHX1926

## 2018-10-09 LAB — ABO/RH: ABO/RH(D): O POS

## 2018-10-09 LAB — POCT PREGNANCY, URINE: Preg Test, Ur: NEGATIVE

## 2018-10-09 SURGERY — HYSTERECTOMY, TOTAL, LAPAROSCOPIC
Anesthesia: General

## 2018-10-09 MED ORDER — ACETAMINOPHEN 10 MG/ML IV SOLN
INTRAVENOUS | Status: AC
  Filled 2018-10-09: qty 100

## 2018-10-09 MED ORDER — HEPARIN SODIUM (PORCINE) 5000 UNIT/ML IJ SOLN
INTRAMUSCULAR | Status: AC
  Filled 2018-10-09: qty 1

## 2018-10-09 MED ORDER — ROCURONIUM BROMIDE 50 MG/5ML IV SOLN
INTRAVENOUS | Status: AC
  Filled 2018-10-09: qty 1

## 2018-10-09 MED ORDER — CEFAZOLIN SODIUM-DEXTROSE 2-4 GM/100ML-% IV SOLN
INTRAVENOUS | Status: AC
  Filled 2018-10-09: qty 100

## 2018-10-09 MED ORDER — DEXAMETHASONE SODIUM PHOSPHATE 10 MG/ML IJ SOLN
INTRAMUSCULAR | Status: DC | PRN
  Administered 2018-10-09: 10 mg via INTRAVENOUS
  Administered 2018-10-09: 8 mg via INTRAVENOUS

## 2018-10-09 MED ORDER — FENTANYL CITRATE (PF) 100 MCG/2ML IJ SOLN
INTRAMUSCULAR | Status: AC
  Filled 2018-10-09: qty 2

## 2018-10-09 MED ORDER — FAMOTIDINE 20 MG PO TABS
20.0000 mg | ORAL_TABLET | Freq: Once | ORAL | Status: AC
  Administered 2018-10-09: 20 mg via ORAL

## 2018-10-09 MED ORDER — FENTANYL CITRATE (PF) 100 MCG/2ML IJ SOLN
25.0000 ug | INTRAMUSCULAR | Status: AC | PRN
  Administered 2018-10-09 (×2): 25 ug via INTRAVENOUS

## 2018-10-09 MED ORDER — SUGAMMADEX SODIUM 200 MG/2ML IV SOLN
INTRAVENOUS | Status: AC
  Filled 2018-10-09: qty 2

## 2018-10-09 MED ORDER — KETOROLAC TROMETHAMINE 30 MG/ML IJ SOLN
INTRAMUSCULAR | Status: AC
  Administered 2018-10-09: 30 mg via INTRAVENOUS
  Filled 2018-10-09: qty 1

## 2018-10-09 MED ORDER — GABAPENTIN 800 MG PO TABS: 800.0000 mg | ORAL_TABLET | Freq: Every day | ORAL | 0 refills | Status: AC

## 2018-10-09 MED ORDER — FAMOTIDINE 20 MG PO TABS
ORAL_TABLET | ORAL | Status: AC
  Filled 2018-10-09: qty 1

## 2018-10-09 MED ORDER — FENTANYL CITRATE (PF) 100 MCG/2ML IJ SOLN
25.0000 ug | INTRAMUSCULAR | Status: AC | PRN
  Administered 2018-10-09 (×6): 25 ug via INTRAVENOUS

## 2018-10-09 MED ORDER — CELECOXIB 200 MG PO CAPS
ORAL_CAPSULE | ORAL | Status: AC
  Filled 2018-10-09: qty 2

## 2018-10-09 MED ORDER — BUPIVACAINE HCL (PF) 0.5 % IJ SOLN
INTRAMUSCULAR | Status: AC
  Filled 2018-10-09: qty 30

## 2018-10-09 MED ORDER — ONDANSETRON 4 MG PO TBDP: 4.0000 mg | ORAL_TABLET | Freq: Four times a day (QID) | ORAL | 0 refills | Status: AC | PRN

## 2018-10-09 MED ORDER — CELECOXIB 200 MG PO CAPS
400.0000 mg | ORAL_CAPSULE | ORAL | Status: AC
  Administered 2018-10-09: 400 mg via ORAL

## 2018-10-09 MED ORDER — ONDANSETRON HCL 4 MG/2ML IJ SOLN
INTRAMUSCULAR | Status: AC
  Filled 2018-10-09: qty 2

## 2018-10-09 MED ORDER — GABAPENTIN 300 MG PO CAPS
900.0000 mg | ORAL_CAPSULE | ORAL | Status: AC
  Administered 2018-10-09: 900 mg via ORAL

## 2018-10-09 MED ORDER — DEXAMETHASONE SODIUM PHOSPHATE 10 MG/ML IJ SOLN
INTRAMUSCULAR | Status: AC
  Filled 2018-10-09: qty 1

## 2018-10-09 MED ORDER — OXYCODONE HCL 5 MG PO TABS
ORAL_TABLET | ORAL | Status: AC
  Administered 2018-10-09: 5 mg via ORAL
  Filled 2018-10-09: qty 1

## 2018-10-09 MED ORDER — ACETAMINOPHEN 500 MG PO TABS
ORAL_TABLET | ORAL | Status: AC
  Filled 2018-10-09: qty 2

## 2018-10-09 MED ORDER — OXYCODONE HCL 5 MG PO TABS
5.0000 mg | ORAL_TABLET | Freq: Once | ORAL | Status: AC
  Administered 2018-10-09: 5 mg via ORAL

## 2018-10-09 MED ORDER — FENTANYL CITRATE (PF) 100 MCG/2ML IJ SOLN
25.0000 ug | INTRAMUSCULAR | Status: AC | PRN
  Administered 2018-10-09 (×4): 25 ug via INTRAVENOUS

## 2018-10-09 MED ORDER — ACETAMINOPHEN 500 MG PO TABS: 1000.0000 mg | ORAL_TABLET | Freq: Four times a day (QID) | ORAL | 0 refills | Status: AC

## 2018-10-09 MED ORDER — FENTANYL CITRATE (PF) 100 MCG/2ML IJ SOLN
INTRAMUSCULAR | Status: AC
  Administered 2018-10-09: 25 ug via INTRAVENOUS
  Filled 2018-10-09: qty 2

## 2018-10-09 MED ORDER — MIDAZOLAM HCL 2 MG/2ML IJ SOLN
INTRAMUSCULAR | Status: DC | PRN
  Administered 2018-10-09 (×2): 1 mg via INTRAVENOUS

## 2018-10-09 MED ORDER — FENTANYL CITRATE (PF) 100 MCG/2ML IJ SOLN
INTRAMUSCULAR | Status: DC | PRN
  Administered 2018-10-09: 100 ug via INTRAVENOUS
  Administered 2018-10-09: 25 ug via INTRAVENOUS

## 2018-10-09 MED ORDER — SODIUM CHLORIDE (PF) 0.9 % IJ SOLN
INTRAMUSCULAR | Status: AC
  Filled 2018-10-09: qty 10

## 2018-10-09 MED ORDER — PROPOFOL 10 MG/ML IV BOLUS
INTRAVENOUS | Status: DC | PRN
  Administered 2018-10-09: 160 mg via INTRAVENOUS

## 2018-10-09 MED ORDER — DOCUSATE SODIUM 100 MG PO CAPS: 100.0000 mg | ORAL_CAPSULE | Freq: Two times a day (BID) | ORAL | 0 refills | Status: AC

## 2018-10-09 MED ORDER — OXYCODONE HCL 5 MG PO CAPS: 5.0000 mg | ORAL_CAPSULE | Freq: Four times a day (QID) | ORAL | 0 refills | Status: AC | PRN

## 2018-10-09 MED ORDER — MIDAZOLAM HCL 2 MG/2ML IJ SOLN
INTRAMUSCULAR | Status: AC
  Filled 2018-10-09: qty 2

## 2018-10-09 MED ORDER — LIDOCAINE HCL (CARDIAC) PF 100 MG/5ML IV SOSY
PREFILLED_SYRINGE | INTRAVENOUS | Status: DC | PRN
  Administered 2018-10-09: 80 mg via INTRAVENOUS

## 2018-10-09 MED ORDER — IBUPROFEN 800 MG PO TABS: 800.0000 mg | ORAL_TABLET | Freq: Three times a day (TID) | ORAL | 1 refills | Status: AC | PRN

## 2018-10-09 MED ORDER — SUGAMMADEX SODIUM 200 MG/2ML IV SOLN
INTRAVENOUS | Status: DC | PRN
  Administered 2018-10-09: 200 mg via INTRAVENOUS

## 2018-10-09 MED ORDER — EPHEDRINE SULFATE 50 MG/ML IJ SOLN
INTRAMUSCULAR | Status: DC | PRN
  Administered 2018-10-09: 10 mg via INTRAVENOUS

## 2018-10-09 MED ORDER — HEPARIN SODIUM (PORCINE) 5000 UNIT/ML IJ SOLN: 5000.0000 [IU] | INTRAMUSCULAR | Status: DC

## 2018-10-09 MED ORDER — PROPOFOL 10 MG/ML IV BOLUS
INTRAVENOUS | Status: AC
  Filled 2018-10-09: qty 20

## 2018-10-09 MED ORDER — KETOROLAC TROMETHAMINE 30 MG/ML IJ SOLN
30.0000 mg | Freq: Once | INTRAMUSCULAR | Status: AC
  Administered 2018-10-09: 30 mg via INTRAVENOUS

## 2018-10-09 MED ORDER — GABAPENTIN 300 MG PO CAPS
ORAL_CAPSULE | ORAL | Status: AC
  Filled 2018-10-09: qty 3

## 2018-10-09 MED ORDER — PROMETHAZINE HCL 25 MG/ML IJ SOLN: 6.2500 mg | INTRAMUSCULAR | Status: DC | PRN

## 2018-10-09 MED ORDER — ONDANSETRON HCL 4 MG/2ML IJ SOLN
INTRAMUSCULAR | Status: DC | PRN
  Administered 2018-10-09: 4 mg via INTRAVENOUS

## 2018-10-09 MED ORDER — ACETAMINOPHEN 500 MG PO TABS
1000.0000 mg | ORAL_TABLET | ORAL | Status: AC
  Administered 2018-10-09: 1000 mg via ORAL

## 2018-10-09 MED ORDER — SEVOFLURANE IN SOLN
RESPIRATORY_TRACT | Status: AC
  Filled 2018-10-09: qty 250

## 2018-10-09 MED ORDER — LACTATED RINGERS IV SOLN
INTRAVENOUS | Status: DC
  Administered 2018-10-09: 11:00:00 via INTRAVENOUS

## 2018-10-09 MED ORDER — ROCURONIUM BROMIDE 100 MG/10ML IV SOLN
INTRAVENOUS | Status: DC | PRN
  Administered 2018-10-09: 10 mg via INTRAVENOUS
  Administered 2018-10-09: 20 mg via INTRAVENOUS
  Administered 2018-10-09: 40 mg via INTRAVENOUS

## 2018-10-09 MED ORDER — ACETAMINOPHEN 10 MG/ML IV SOLN
1000.0000 mg | Freq: Once | INTRAVENOUS | Status: AC
  Administered 2018-10-09: 1000 mg via INTRAVENOUS

## 2018-10-09 SURGICAL SUPPLY — 68 items
ADH SKN CLS APL DERMABOND .7 (GAUZE/BANDAGES/DRESSINGS) ×2
BAG SPEC RTRVL LRG 6X4 10 (ENDOMECHANICALS)
BAG URINE DRAINAGE (UROLOGICAL SUPPLIES) ×8 IMPLANT
BLADE SURG SZ11 CARB STEEL (BLADE) ×4 IMPLANT
CATH FOLEY 2WAY  5CC 16FR (CATHETERS) ×2
CATH FOLEY 2WAY 5CC 16FR (CATHETERS) ×2
CATH URTH 16FR FL 2W BLN LF (CATHETERS) ×2 IMPLANT
CHLORAPREP W/TINT 26ML (MISCELLANEOUS) ×4 IMPLANT
CLOSURE WOUND 1/4X4 (GAUZE/BANDAGES/DRESSINGS) ×1
CORD MONOPOLAR M/FML 12FT (MISCELLANEOUS) ×4 IMPLANT
COUNTER NEEDLE 20/40 LG (NEEDLE) ×4 IMPLANT
COVER LIGHT HANDLE STERIS (MISCELLANEOUS) ×8 IMPLANT
COVER WAND RF STERILE (DRAPES) ×4 IMPLANT
DERMABOND ADVANCED (GAUZE/BANDAGES/DRESSINGS) ×2
DERMABOND ADVANCED .7 DNX12 (GAUZE/BANDAGES/DRESSINGS) ×2 IMPLANT
DEVICE SUTURE ENDOST 10MM (ENDOMECHANICALS) ×4 IMPLANT
DRAPE GENERAL ENDO 106X123.5 (DRAPES) ×4 IMPLANT
DRAPE LEGGINS SURG 28X43 STRL (DRAPES) ×4 IMPLANT
DRAPE STERI POUCH LG 24X46 STR (DRAPES) ×4 IMPLANT
DRAPE UNDER BUTTOCK W/FLU (DRAPES) ×4 IMPLANT
DRSG TEGADERM 2-3/8X2-3/4 SM (GAUZE/BANDAGES/DRESSINGS) ×12 IMPLANT
GLOVE BIO SURGEON STRL SZ7 (GLOVE) ×12 IMPLANT
GLOVE INDICATOR 7.5 STRL GRN (GLOVE) ×4 IMPLANT
GOWN STRL REUS W/ TWL LRG LVL3 (GOWN DISPOSABLE) ×4 IMPLANT
GOWN STRL REUS W/ TWL XL LVL3 (GOWN DISPOSABLE) ×2 IMPLANT
GOWN STRL REUS W/TWL LRG LVL3 (GOWN DISPOSABLE) ×8
GOWN STRL REUS W/TWL XL LVL3 (GOWN DISPOSABLE) ×4
GRASPER SUT TROCAR 14GX15 (MISCELLANEOUS) ×4 IMPLANT
HEMOSTAT ARISTA ABSORB 1G (HEMOSTASIS) ×2 IMPLANT
IRRIGATION STRYKERFLOW (MISCELLANEOUS) ×2 IMPLANT
IRRIGATOR STRYKERFLOW (MISCELLANEOUS) ×4
IV NS 1000ML (IV SOLUTION) ×4
IV NS 1000ML BAXH (IV SOLUTION) ×2 IMPLANT
KIT PINK PAD W/HEAD ARE REST (MISCELLANEOUS) ×4
KIT PINK PAD W/HEAD ARM REST (MISCELLANEOUS) ×2 IMPLANT
KIT TURNOVER CYSTO (KITS) ×4 IMPLANT
LABEL OR SOLS (LABEL) ×4 IMPLANT
LIGASURE VESSEL 5MM BLUNT TIP (ELECTROSURGICAL) ×4 IMPLANT
MANIPULATOR VCARE LG CRV RETR (MISCELLANEOUS) IMPLANT
MANIPULATOR VCARE SML CRV RETR (MISCELLANEOUS) IMPLANT
MANIPULATOR VCARE STD CRV RETR (MISCELLANEOUS) ×2 IMPLANT
NS IRRIG 500ML POUR BTL (IV SOLUTION) ×4 IMPLANT
OCCLUDER COLPOPNEUMO (BALLOONS) ×4 IMPLANT
PACK GYN LAPAROSCOPIC (MISCELLANEOUS) ×4 IMPLANT
PAD OB MATERNITY 4.3X12.25 (PERSONAL CARE ITEMS) ×4 IMPLANT
PAD PREP 24X41 OB/GYN DISP (PERSONAL CARE ITEMS) ×4 IMPLANT
POUCH SPECIMEN RETRIEVAL 10MM (ENDOMECHANICALS) IMPLANT
SCISSORS METZENBAUM CVD 33 (INSTRUMENTS) ×2 IMPLANT
SET CYSTO W/LG BORE CLAMP LF (SET/KITS/TRAYS/PACK) IMPLANT
SLEEVE ENDOPATH XCEL 5M (ENDOMECHANICALS) ×4 IMPLANT
SPONGE GAUZE 2X2 8PLY STER LF (GAUZE/BANDAGES/DRESSINGS) ×2
SPONGE GAUZE 2X2 8PLY STRL LF (GAUZE/BANDAGES/DRESSINGS) ×6 IMPLANT
STRIP CLOSURE SKIN 1/4X4 (GAUZE/BANDAGES/DRESSINGS) ×3 IMPLANT
SUT ENDO VLOC 180-0-8IN (SUTURE) ×4 IMPLANT
SUT MNCRL 4-0 (SUTURE) ×4
SUT MNCRL 4-0 27XMFL (SUTURE) ×2
SUT MNCRL AB 4-0 PS2 18 (SUTURE) ×4 IMPLANT
SUT VIC AB 0 CT1 36 (SUTURE) ×8 IMPLANT
SUT VIC AB 2-0 UR6 27 (SUTURE) ×4 IMPLANT
SUT VIC AB 4-0 SH 27 (SUTURE) ×4
SUT VIC AB 4-0 SH 27XANBCTRL (SUTURE) ×2 IMPLANT
SUTURE MNCRL 4-0 27XMF (SUTURE) ×2 IMPLANT
SYR 10ML LL (SYRINGE) ×4 IMPLANT
SYR 50ML LL SCALE MARK (SYRINGE) ×4 IMPLANT
TROCAR ENDO BLADELESS 11MM (ENDOMECHANICALS) ×2 IMPLANT
TROCAR XCEL NON-BLD 5MMX100MML (ENDOMECHANICALS) ×4 IMPLANT
TUBING INSUF HEATED (TUBING) ×4 IMPLANT
TUBING INSUFFLATION (TUBING) ×4 IMPLANT

## 2018-10-09 NOTE — Progress Notes (Signed)
Pt continues to have pain   Comes and goes  Called dr Mellody Memos ordered tylenol and toradol also  And to get up and void

## 2018-10-09 NOTE — Op Note (Signed)
Samantha Cunningham PROCEDURE DATE: 10/09/2018  PREOPERATIVE DIAGNOSIS: Abnormal pap smears, pelvic pain - hx of endometriosis, IUD in uterus left insitu POSTOPERATIVE DIAGNOSIS: The same PROCEDURE: Total laparoscopic hysterectomy, bilateral salpingectomy. SURGEON:  Dr. Christeen Douglas ASSISTANT: Dr. Leeroy Bock Ward Anesthesiologist:  Anesthesiologist: Lenard Simmer, MD CRNA: Henrietta Hoover, CRNA; Karoline Caldwell, CRNA  INDICATIONS: 48 y.o. F  here for definitive surgical management secondary to the indications listed under preoperative diagnoses; please see preoperative note for further details.  Risks of surgery were discussed with the patient including but not limited to: bleeding which may require transfusion or reoperation; infection which may require antibiotics; injury to bowel, bladder, ureters or other surrounding organs; need for additional procedures; thromboembolic phenomenon, incisional problems and other postoperative/anesthesia complications. Written informed consent was obtained.    FINDINGS:   Normal uterus, tubes and ovaries. Old endometriosis scarring in peritoneum. IUD strings not noted at the external os. Abdominal adhesions in RLQ  ANESTHESIA:    General INTRAVENOUS FLUIDS:1000  ml ESTIMATED BLOOD LOSS:20 ml URINE OUTPUT: 300 ml   SPECIMENS: Uterus, cervix, bilateral fallopian tubes  COMPLICATIONS: None immediate  PROCEDURE IN DETAIL:  The patient received prophalactic intravenous antibiotics and had sequential compression devices applied to her lower extremities while in the preoperative area.  She was then taken to the operating room where general anesthesia was administered and was found to be adequate.  She was placed in the dorsal lithotomy position, and was prepped and draped in a sterile manner.  A formal time out was performed with all team members present and in agreement.  A V-care uterine manipulator was placed at this time.  A Foley catheter was inserted into her  bladder and attached to constant drainage. Attention was turned to the abdomen and 0.5% Marcaine infused subq. A 54mm umbilical incision was made with the scalpel.  The Optiview 11-mm trocar and sleeve were then advanced without difficulty with the laparoscope under direct visualization into the abdomen.  The abdomen was then insufflated with carbon dioxide gas and adequate pneumoperitoneum was obtained.  A survey of the patient's pelvis and abdomen revealed the findings above.  Bilateral lower quadrant ports (5 mm on the right and 5 mm on the left) were then placed under direct visualization.  The pelvis was then carefully examined.  Attention was turned to the fallopian tubes; these were freed from the underlying mesosalpinx and the uterine attachments using the Ligasure device.  The bilateral round and broad ligaments were then clamped and transected with the Ligasure device.  The uterine artery was then skeletonized and a bladder flap was created.  The ureters were noted to be safely away from the area of dissection.  The bladder was then bluntly dissected off the lower uterine segment.    At this point, attention was turned to the uterine vessels, which were clamped and cauterized using the Ligasure on the left, and then the right. After the uterine blood flow at the level of the internal os was controlled, both arteries were cut with the Ligasure.  Good hemostasis was noted overall.  The uterosacral and cardinal ligaments were clamped, cut and ligated bilaterally .  Attention was then turned to the cervicovaginal junction, and monopolar scissors were used to transect the cervix from the surrounding vagina using the ring of the V-care as a guide. This was done circumferentially allowing total hysterectomy.  The uterus was then removed from the vagina and the vaginal cuff incision was then closed with running V-loc suture.  Overall  excellent hemostasis was noted.    Attention was returned to the abdomen.The  ureters were reexamined bilaterally and were pulsating normally. The abdominal pressure was reduced and hemostasis was confirmed, with Arista placed on right bladder site.   The 57mm port fascia was closed with a vertical mattress with 0-Vicryl, using the cone closure system. All trocars were removed under direct visualization, and the abdomen was desufflated.  All skin incisions were closed with 4-0 Vicryl subcuticular stitches and Dermabond. The patient tolerated the procedures well.  All instruments, needles, and sponge counts were correct x 2. The patient was taken to the recovery room awake, extubated and in stable condition.

## 2018-10-09 NOTE — Interval H&P Note (Signed)
History and Physical Interval Note:  10/09/2018 10:28 AM  Samantha Cunningham  has presented today for surgery, with the diagnosis of endometriosis, abnormal pap  The various methods of treatment have been discussed with the patient and family. After consideration of risks, benefits and other options for treatment, the patient has consented to  Procedure(s): HYSTERECTOMY TOTAL LAPAROSCOPIC (N/A) LAPAROSCOPIC BILATERAL SALPINGECTOMY (Bilateral) as a surgical intervention .  The patient's history has been reviewed, patient examined, no change in status, stable for surgery.  I have reviewed the patient's chart and labs.  Questions were answered to the patient's satisfaction.    EMBx results:  ENDOMETRIUM, BIOPSY: BENIGN ENDOMETRIAL FRAGMENTS WITH UNDERDEVELOPED GLANDS AND DECIDUALIZED STROMA COMPATIBLE WITH PROGESTIN EFFECT. BUL 09/28/2018 1618 Local   Farhad Burleson Dalbert Garnet

## 2018-10-09 NOTE — Anesthesia Preprocedure Evaluation (Signed)
Anesthesia Evaluation   Patient awake    Reviewed: Allergy & Precautions, NPO status , Patient's Chart, lab work & pertinent test results  History of Anesthesia Complications Negative for: history of anesthetic complications  Airway Mallampati: II       Dental  (+) Chipped   Pulmonary neg pulmonary ROS,    Pulmonary exam normal        Cardiovascular negative cardio ROS Normal cardiovascular exam     Neuro/Psych PSYCHIATRIC DISORDERS Depression negative neurological ROS     GI/Hepatic negative GI ROS, Neg liver ROS,   Endo/Other  neg diabetesMorbid obesity  Renal/GU negative Renal ROS  negative genitourinary   Musculoskeletal Knee derangement   Abdominal Normal abdominal exam  (+) + obese,   Peds negative pediatric ROS (+)  Hematology negative hematology ROS (+)   Anesthesia Other Findings Patient has issues with Fentanyl patch  Reproductive/Obstetrics                             Anesthesia Physical  Anesthesia Plan  ASA: III  Anesthesia Plan: General   Post-op Pain Management:    Induction: Intravenous  PONV Risk Score and Plan: 3 and Ondansetron, Dexamethasone, Midazolam, Treatment may vary due to age or medical condition and Promethazine  Airway Management Planned: Oral ETT  Additional Equipment:   Intra-op Plan:   Post-operative Plan: Extubation in OR  Informed Consent: I have reviewed the patients History and Physical, chart, labs and discussed the procedure including the risks, benefits and alternatives for the proposed anesthesia with the patient or authorized representative who has indicated his/her understanding and acceptance.     Dental advisory given  Plan Discussed with: CRNA and Surgeon  Anesthesia Plan Comments:         Anesthesia Quick Evaluation

## 2018-10-09 NOTE — Anesthesia Post-op Follow-up Note (Signed)
Anesthesia QCDR form completed.        

## 2018-10-09 NOTE — Anesthesia Procedure Notes (Signed)
Procedure Name: Intubation Date/Time: 10/09/2018 10:50 AM Performed by: Allean Found, CRNA Pre-anesthesia Checklist: Patient identified, Patient being monitored, Timeout performed, Emergency Drugs available and Suction available Patient Re-evaluated:Patient Re-evaluated prior to induction Oxygen Delivery Method: Circle system utilized Preoxygenation: Pre-oxygenation with 100% oxygen Induction Type: IV induction Ventilation: Mask ventilation without difficulty Laryngoscope Size: Mac and 3 Grade View: Grade I Tube type: Oral Tube size: 7.0 mm Number of attempts: 1 Airway Equipment and Method: Stylet Placement Confirmation: ETT inserted through vocal cords under direct vision,  positive ETCO2 and breath sounds checked- equal and bilateral Secured at: 21 cm Tube secured with: Tape Dental Injury: Teeth and Oropharynx as per pre-operative assessment

## 2018-10-09 NOTE — Transfer of Care (Signed)
Immediate Anesthesia Transfer of Care Note  Patient: Samantha Cunningham  Procedure(s) Performed: HYSTERECTOMY TOTAL LAPAROSCOPIC (N/A ) LAPAROSCOPIC BILATERAL SALPINGECTOMY (Bilateral )  Patient Location: PACU  Anesthesia Type:General  Level of Consciousness: awake and oriented  Airway & Oxygen Therapy: Patient Spontanous Breathing and Patient connected to face mask oxygen  Post-op Assessment: Report given to RN and Post -op Vital signs reviewed and stable  Post vital signs: Reviewed and stable  Last Vitals:  Vitals Value Taken Time  BP 114/60 10/09/2018  1:10 PM  Temp 36.6 C 10/09/2018  1:10 PM  Pulse 96 10/09/2018  1:13 PM  Resp 16 10/09/2018  1:13 PM  SpO2 100 % 10/09/2018  1:13 PM  Vitals shown include unvalidated device data.  Last Pain:  Vitals:   10/09/18 0847  TempSrc: Oral  PainSc: 0-No pain         Complications: No apparent anesthesia complications

## 2018-10-09 NOTE — Discharge Instructions (Signed)
Discharge instructions after   total laparoscopic hysterectomy   For the next three days, take ibuprofen and acetaminophen on a schedule, every 8 hours. You can take them together or you can intersperse them, and take one every four hours. I also gave you gabapentin for nighttime, to help you sleep and also to control pain. Take gabapentin medicines at night for at least the next 3 nights. You also have a narcotic, oxycodone, to take as needed if the above medicines don't help.  Postop constipation is a major cause of pain. Stay well hydrated, walk as you tolerate, and take over the counter senna as well as stool softeners if you need them.    Signs and Symptoms to Report Call our office at [phone] if you have any of the following.  . Fever over 100.4 degrees or higher . Severe stomach pain not relieved with pain medications . Bright red bleeding that's heavier than a period that does not slow with rest . To go the bathroom a lot (frequency), you can't hold your urine (urgency), or it hurts when you empty your bladder (urinate) . Chest pain . Shortness of breath . Pain in the calves of your legs . Severe nausea and vomiting not relieved with anti-nausea medications . Signs of infection around your wounds, such as redness, hot to touch, swelling, green/yellow drainage (like pus), bad smelling discharge . Any concerns  What You Can Expect after Surgery . You may see some pink tinged, bloody fluid and bruising around the wound. This is normal. . You may notice shoulder and neck pain. This is caused by the gas used during surgery to expand your abdomen so your surgeon could get to the uterus easier. . You may have a sore throat because of the tube in your mouth during general anesthesia. This will go away in 2 to 3 days. . You may have some stomach cramps. . You may notice spotting on your panties. . You may have pain around the incision sites.   Activities after Your  Discharge Follow these guidelines to help speed your recovery at home: . Do the coughing and deep breathing as you did in the hospital for 2 weeks. Use the small blue breathing device, called the incentive spirometer for 2 weeks. . Don't drive if you are in pain or taking narcotic pain medicine. You may drive when you can safely slam on the brakes, turn the wheel forcefully, and rotate your torso comfortably. This is typically 1-2 weeks. Practice in a parking lot or side street prior to attempting to drive regularly.  . Ask others to help with household chores for 4 weeks. . Do not lift anything heavier that 10 pounds for 4-6 weeks. This includes pets, children, and groceries. . Don't do strenuous activities, exercises, or sports like vacuuming, tennis, squash, etc. until your doctor says it is safe to do so. ---Maintain pelvic rest for 8 weeks. This means nothing in the vagina or rectum at all (no douching, tampons, intercourse) for 8 weeks.  . Walk as you feel able. Rest often since it may take two or three weeks for your energy level to return to normal.  . You may climb stairs . Avoid constipation:   -Eat fruits, vegetables, and whole grains. Eat small meals as your appetite will take time to return to normal.   -Drink 6 to 8 glasses of water each day unless your doctor has told you to limit your fluids.   -Use a laxative   or stool softener as needed if constipation becomes a problem. You may take Miralax, metamucil, Citrucil, Colace, Senekot, FiberCon, etc. If this does not relieve the constipation, try two tablespoons of Milk Of Magnesia every 8 hours until your bowels move.  . You may shower. Gently wash the wounds with a mild soap and water. Pat dry. . Do not get in a hot tub, swimming pool, etc. for 6 weeks. . Do not use lotions, oils, powders on the wounds. . Do not douche, use tampons, or have sex until your doctor says it is okay. . Take your pain medicine when you need it. The medicine  may not work as well if the pain is bad.  Take the medicines you were taking before surgery. Other medications you will need are pain medications and possibly constipation and nausea medications (Zofran).     Here is a helpful article from the website [employer], regarding constipation  Here are reasons why constipation occurs after surgery: 1) During the operation and in the recovery room, most people are given opioid pain medication, primarily through an IV, to treat moderate or severe pain. Intravenous opioids include morphine, Dilaudid and fentanyl. After surgery, patients are often prescribed opioid pain medication to take by mouth at home, including codeine, Vicodin, Norco, and Percocet. All of these medications cause constipation by slowing down the movement of your intestine. 2) Changes in your diet before surgery can be another culprit. It is common to get specific instructions to change how you normally eat or drink before your surgery, like only having liquids the day before or not having anything to eat or drink after midnight the night before surgery. For this reason, temporary dehydration may occur. This, along with not eating or only having liquids, means that you are getting less fiber than usual. Both these factors contribute to constipation. 3) Changes in your diet after surgery can also contribute to the problem. Although many people don't have dietary restrictions after operations, being under anesthesia can make you lose your appetite for several hours and maybe even days. Some people can even have nausea or vomiting. Not eating or drinking normally means that you are not getting enough fiber and you can get dehydrated, both leading to constipation. 4) Lying in a bed more than usual--which happens before, during and after surgery--combined with the medications and diet changes, all work together to slow down your colon and make your poop turn to rock.  No one likes to be  constipated.  Let's face it, it's not a pleasant feeling when you don't poop for days, then strain on the toilet to finally pass something large enough to cause damage. An ounce of prevention is worth a pound of cure, so: 1. Assume you will be constipated. 2. Plan and prepare accordingly. Post-surgery is one of those unique situations where the temporary use of laxatives can make a world of difference. Always consult with your doctor, and recognize that if you wait several days after surgery to take a laxative, the constipation might be too severe for these over-the-counter options. It is always important to discuss all medications you plan on taking with your doctor. Ask your doctor if you can start the laxative immediately after surgery. *  Here are go-to post-surgery laxatives: Senna: Senna is an herb that acts as a "stimulant laxative," meaning it increases the activity of the intestine to cause you to have a bowel movement. It comes in many forms, but senna pills are easy to take   and are sold over the counter at almost all pharmacies. Since opioid pain medications slow down the activity of the intestine, it makes sense to take a medication to help reverse that side effect. Long-term use of a stimulant laxative is not a good idea since it can make your colon "lazy" and not function properly; however, temporary use immediately after surgery is acceptable. In general, if you are able to eat a normal diet, taking senna soon after surgery works the best. Senna usually works within hours to produce a bowel movement, but this is less predictable when you are taking different medications after surgery. Try not to wait several days to start taking senna, as often it is too late by then. Just like with all medications or supplements, check with your doctor before starting new treatment.   Magnesium: Magnesium is an important mineral that our body needs. We get magnesium from some foods that we eat, especially  foods that are high in fiber such as broccoli, almonds and whole grains. There are also magnesium-based medications used to treat constipation including milk of magnesia (magnesium hydroxide), magnesium citrate and magnesium oxide. They work by drawing water into the intestine, putting it into the class of "osmotic" laxatives. Magnesium products in low doses appear to be safe, but if taken in very large doses, can lead to problems such as irregular heartbeat, low blood pressure and even death. It can also affect other medications you might be taking, therefore it is important to discuss using magnesium with your physician and pharmacist before initiating therapy. Most over-the-counter magnesium laxatives work very well to help with the constipation related to surgery, but sometimes they work too well and lead to diarrhea. Make sure you are somewhere with easy access to a bathroom, just in case.   Bisacodyl: Bisacodyl (generic name) is sold under brand names such as Dulcolax. Much like senna, it is a "stimulant laxative," meaning it makes your intestines move more quickly to push out the stool. This is another good choice to start taking as soon as your doctor says you can take a laxative after surgery. It comes in pill form and as a suppository, which is a good choice for people who cannot or are not allowed to swallow pills. Studies have shown that it works as a laxative, but like most of these medications, you should use this on a short-term basis only.   Enema: Enemas strike fear in many people, but FEAR NOT! It's nowhere near as big a deal as you may think. An enema is just a way to get some liquid into your rectum by placing a specially designed device through your anus. If you have never done one, it might seem like a painful, unpleasant, uncomfortable, complicated and lengthy procedure. But in reality, it's simple, takes just a few seconds and is highly effective. The small ready-made bottles you buy at  the pharmacy are much easier than the hose/large rubber container type. Those recommended positions illustrated in some instructions are generally not necessary to place the enema. It's very similar to the insertion of a tampon, requiring a slight squat. Some extra lubrication on the enema's tip (or on your anus) will make it a breeze. In certain cases, there is no substitute for a good enema. For example, if someone has not pooped for a few days, the beginning of the poop waiting to come out can become rock hard. Passing that hard stool can lead to much pain and problems like anal fissures.   Inserting a little liquid to break up the rock-hard stool will help make its passage much easier. Enemas come with different liquids. Most come with saline, but there are also mineral oil options. You can also use warm water in the reusable enema containers. They all work. But since saline can sometimes be irritating, so try a mineral oil or water enema instead.  Here are commonly recommended constipation medications that do not work well for post-surgery constipation: Docusate: Docusate (generic name) most commonly referred to as Colace (brand name) is not really a laxative, but is classified as a stool softener. Although this medication is commonly prescribed, it is not recommended for several reasons: 1) there is no good medical evidence that it works 2) even if it has an effect, which is very questionable, it is minimal and cannot combat the intestinal slowing caused by the opioid medications. Skip docusate to save money and space in your pillbox for something more effective.  PEG: Miralax (brand name) is basically a chemical called polyethylene glycol (PEG) and it has gained tremendous popularity as a laxative. This product is an "osmotic laxative" meaning it works by pulling water into the stool, making it softer. This is very similar to the action of natural fiber in foods and supplements. Therefore, the effect seen  by this medication is not immediate, causing a bowel movement in a day or more. Is this medication strong enough to battle the constipation related to having an operation? Maybe for some people not prone to constipation. But for most people, other laxatives are better to prevent constipation after surgery.   AMBULATORY SURGERY  DISCHARGE INSTRUCTIONS   1) The drugs that you were given will stay in your system until tomorrow so for the next 24 hours you should not:  A) Drive an automobile B) Make any legal decisions C) Drink any alcoholic beverage   2) You may resume regular meals tomorrow.  Today it is better to start with liquids and gradually work up to solid foods.  You may eat anything you prefer, but it is better to start with liquids, then soup and crackers, and gradually work up to solid foods.   3) Please notify your doctor immediately if you have any unusual bleeding, trouble breathing, redness and pain at the surgery site, drainage, fever, or pain not relieved by medication.    4) Additional Instructions:        Please contact your physician with any problems or Same Day Surgery at [phone], Monday through Friday 6 am to 4 pm, or [place] [facility] [place] Main number at [phone].  

## 2018-10-12 ENCOUNTER — Encounter: Payer: Self-pay | Admitting: Obstetrics and Gynecology

## 2018-10-12 NOTE — Anesthesia Postprocedure Evaluation (Signed)
Anesthesia Post Note  Patient: Samantha Cunningham  Procedure(s) Performed: HYSTERECTOMY TOTAL LAPAROSCOPIC (N/A ) LAPAROSCOPIC BILATERAL SALPINGECTOMY (Bilateral )  Patient location during evaluation: PACU Anesthesia Type: General Level of consciousness: awake and alert Pain management: pain level controlled Vital Signs Assessment: post-procedure vital signs reviewed and stable Respiratory status: spontaneous breathing, nonlabored ventilation, respiratory function stable and patient connected to nasal cannula oxygen Cardiovascular status: blood pressure returned to baseline and stable Postop Assessment: no apparent nausea or vomiting Anesthetic complications: no     Last Vitals:  Vitals:   10/09/18 1600 10/09/18 1621  BP: 112/66 105/72  Pulse: 100 99  Resp: 15   Temp: 36.8 C 36.6 C  SpO2: 93% 98%    Last Pain:  Vitals:   10/09/18 1621  TempSrc: Temporal  PainSc: 4                  Lenard Simmer

## 2018-10-13 LAB — SURGICAL PATHOLOGY

## 2019-07-16 ENCOUNTER — Other Ambulatory Visit: Payer: Self-pay

## 2019-07-16 ENCOUNTER — Ambulatory Visit (LOCAL_COMMUNITY_HEALTH_CENTER): Payer: Self-pay

## 2019-07-16 DIAGNOSIS — Z111 Encounter for screening for respiratory tuberculosis: Secondary | ICD-10-CM

## 2019-07-19 ENCOUNTER — Ambulatory Visit (LOCAL_COMMUNITY_HEALTH_CENTER): Payer: Self-pay

## 2019-07-19 ENCOUNTER — Other Ambulatory Visit: Payer: Self-pay

## 2019-07-19 DIAGNOSIS — Z111 Encounter for screening for respiratory tuberculosis: Secondary | ICD-10-CM

## 2019-07-19 LAB — TB SKIN TEST
Induration: 0 mm
TB Skin Test: NEGATIVE

## 2022-02-27 ENCOUNTER — Other Ambulatory Visit: Payer: Self-pay | Admitting: Obstetrics and Gynecology

## 2022-02-27 DIAGNOSIS — Z1231 Encounter for screening mammogram for malignant neoplasm of breast: Secondary | ICD-10-CM

## 2022-04-08 ENCOUNTER — Ambulatory Visit
Admission: RE | Admit: 2022-04-08 | Discharge: 2022-04-08 | Disposition: A | Discharge status: Home / Self Care | Payer: BC Managed Care – PPO | Source: Ambulatory Visit | Attending: Obstetrics and Gynecology | Admitting: Obstetrics and Gynecology

## 2022-04-08 DIAGNOSIS — Z1231 Encounter for screening mammogram for malignant neoplasm of breast: Secondary | ICD-10-CM | POA: Diagnosis present

## 2022-04-10 ENCOUNTER — Other Ambulatory Visit: Payer: Self-pay | Admitting: Obstetrics and Gynecology

## 2022-04-10 DIAGNOSIS — R921 Mammographic calcification found on diagnostic imaging of breast: Secondary | ICD-10-CM

## 2022-04-10 DIAGNOSIS — R928 Other abnormal and inconclusive findings on diagnostic imaging of breast: Secondary | ICD-10-CM

## 2022-04-24 ENCOUNTER — Ambulatory Visit
Admission: RE | Admit: 2022-04-24 | Discharge: 2022-04-24 | Disposition: A | Discharge status: Home / Self Care | Payer: BC Managed Care – PPO | Source: Ambulatory Visit | Attending: Obstetrics and Gynecology | Admitting: Obstetrics and Gynecology

## 2022-04-24 DIAGNOSIS — R928 Other abnormal and inconclusive findings on diagnostic imaging of breast: Secondary | ICD-10-CM | POA: Insufficient documentation

## 2022-04-24 DIAGNOSIS — R921 Mammographic calcification found on diagnostic imaging of breast: Secondary | ICD-10-CM | POA: Diagnosis present

## 2022-04-25 ENCOUNTER — Other Ambulatory Visit: Payer: Self-pay | Admitting: Obstetrics and Gynecology

## 2022-04-25 DIAGNOSIS — R928 Other abnormal and inconclusive findings on diagnostic imaging of breast: Secondary | ICD-10-CM

## 2022-04-25 DIAGNOSIS — R921 Mammographic calcification found on diagnostic imaging of breast: Secondary | ICD-10-CM

## 2022-05-08 ENCOUNTER — Ambulatory Visit
Admission: RE | Admit: 2022-05-08 | Discharge: 2022-05-08 | Disposition: A | Discharge status: Home / Self Care | Payer: BC Managed Care – PPO | Source: Ambulatory Visit | Attending: Obstetrics and Gynecology | Admitting: Obstetrics and Gynecology

## 2022-05-08 DIAGNOSIS — R928 Other abnormal and inconclusive findings on diagnostic imaging of breast: Secondary | ICD-10-CM | POA: Diagnosis present

## 2022-05-08 DIAGNOSIS — R921 Mammographic calcification found on diagnostic imaging of breast: Secondary | ICD-10-CM | POA: Insufficient documentation

## 2022-05-08 HISTORY — PX: BREAST BIOPSY: SHX20

## 2022-05-10 LAB — SURGICAL PATHOLOGY

## 2022-05-14 ENCOUNTER — Inpatient Hospital Stay: Admission: RE | Admit: 2022-05-14 | Payer: BC Managed Care – PPO | Source: Ambulatory Visit

## 2022-11-08 ENCOUNTER — Other Ambulatory Visit: Payer: Self-pay

## 2022-11-08 ENCOUNTER — Other Ambulatory Visit (HOSPITAL_COMMUNITY): Payer: Self-pay

## 2022-11-08 MED ORDER — SEMAGLUTIDE-WEIGHT MANAGEMENT 0.25 MG/0.5ML ~~LOC~~ SOAJ
0.2500 mg | SUBCUTANEOUS | 0 refills | Status: DC
  Filled 2022-11-08: qty 2, 28d supply, fill #0

## 2022-12-03 ENCOUNTER — Other Ambulatory Visit (HOSPITAL_COMMUNITY): Payer: Self-pay

## 2022-12-03 MED ORDER — SEMAGLUTIDE-WEIGHT MANAGEMENT 0.5 MG/0.5ML ~~LOC~~ SOAJ
0.5000 mg | SUBCUTANEOUS | 0 refills | Status: AC
  Filled 2022-12-03: qty 2, 28d supply, fill #0

## 2022-12-04 ENCOUNTER — Other Ambulatory Visit (HOSPITAL_COMMUNITY): Payer: Self-pay

## 2022-12-04 MED ORDER — WEGOVY 0.25 MG/0.5ML ~~LOC~~ SOAJ
0.2500 mg | SUBCUTANEOUS | 0 refills | Status: DC
  Filled 2022-12-04: qty 2, 28d supply, fill #0

## 2022-12-05 ENCOUNTER — Other Ambulatory Visit: Payer: Self-pay

## 2022-12-05 ENCOUNTER — Other Ambulatory Visit (HOSPITAL_COMMUNITY): Payer: Self-pay

## 2023-01-22 ENCOUNTER — Other Ambulatory Visit (HOSPITAL_COMMUNITY): Payer: Self-pay

## 2023-01-22 MED ORDER — WEGOVY 1 MG/0.5ML ~~LOC~~ SOAJ
1.0000 mg | SUBCUTANEOUS | 5 refills | Status: DC
  Filled 2023-01-22 – 2023-02-28 (×3): qty 2, 28d supply, fill #0

## 2023-02-04 ENCOUNTER — Other Ambulatory Visit (HOSPITAL_COMMUNITY): Payer: Self-pay

## 2023-02-05 ENCOUNTER — Other Ambulatory Visit (HOSPITAL_COMMUNITY): Payer: Self-pay

## 2023-02-06 ENCOUNTER — Other Ambulatory Visit (HOSPITAL_COMMUNITY): Payer: Self-pay

## 2023-02-28 ENCOUNTER — Other Ambulatory Visit (HOSPITAL_COMMUNITY): Payer: Self-pay

## 2023-03-24 ENCOUNTER — Other Ambulatory Visit (HOSPITAL_COMMUNITY): Payer: Self-pay

## 2023-03-24 MED ORDER — WEGOVY 1.7 MG/0.75ML ~~LOC~~ SOAJ
1.7000 mg | SUBCUTANEOUS | 1 refills | Status: DC
  Filled 2023-03-24 – 2023-03-25 (×2): qty 3, 28d supply, fill #0

## 2023-03-25 ENCOUNTER — Other Ambulatory Visit (HOSPITAL_COMMUNITY): Payer: Self-pay

## 2023-04-01 ENCOUNTER — Other Ambulatory Visit (HOSPITAL_COMMUNITY): Payer: Self-pay

## 2023-04-10 ENCOUNTER — Other Ambulatory Visit (HOSPITAL_COMMUNITY): Payer: Self-pay

## 2023-04-10 MED ORDER — WEGOVY 2.4 MG/0.75ML ~~LOC~~ SOAJ
2.4000 mg | SUBCUTANEOUS | 5 refills | Status: DC
  Filled 2023-04-10: qty 3, 28d supply, fill #0
  Filled 2023-05-14: qty 3, 28d supply, fill #1
  Filled 2023-06-08: qty 3, 28d supply, fill #2
  Filled 2023-07-15 – 2023-08-22 (×3): qty 3, 28d supply, fill #3

## 2023-04-15 ENCOUNTER — Other Ambulatory Visit (HOSPITAL_COMMUNITY): Payer: Self-pay

## 2023-05-16 ENCOUNTER — Other Ambulatory Visit (HOSPITAL_COMMUNITY): Payer: Self-pay

## 2023-07-15 ENCOUNTER — Other Ambulatory Visit (HOSPITAL_COMMUNITY): Payer: Self-pay

## 2023-08-08 ENCOUNTER — Other Ambulatory Visit (HOSPITAL_COMMUNITY): Payer: Self-pay

## 2023-08-22 ENCOUNTER — Other Ambulatory Visit (HOSPITAL_COMMUNITY): Payer: Self-pay

## 2023-11-10 ENCOUNTER — Other Ambulatory Visit (HOSPITAL_COMMUNITY): Payer: Self-pay

## 2023-11-10 MED ORDER — WEGOVY 1.7 MG/0.75ML ~~LOC~~ SOAJ
0.7500 mL | SUBCUTANEOUS | 0 refills | Status: AC
  Filled 2023-11-10 – 2023-11-28 (×2): qty 3, 28d supply, fill #0

## 2023-11-11 ENCOUNTER — Other Ambulatory Visit (HOSPITAL_COMMUNITY): Payer: Self-pay

## 2023-11-28 ENCOUNTER — Other Ambulatory Visit (HOSPITAL_COMMUNITY): Payer: Self-pay

## 2023-12-10 ENCOUNTER — Other Ambulatory Visit (HOSPITAL_COMMUNITY): Payer: Self-pay

## 2024-03-18 ENCOUNTER — Other Ambulatory Visit (HOSPITAL_COMMUNITY): Payer: Self-pay

## 2024-03-18 MED ORDER — WEGOVY 1.7 MG/0.75ML ~~LOC~~ SOAJ
1.7000 mg | SUBCUTANEOUS | 0 refills | Status: AC
  Filled 2024-03-18: qty 3, 28d supply, fill #0

## 2024-03-19 ENCOUNTER — Other Ambulatory Visit (HOSPITAL_COMMUNITY): Payer: Self-pay

## 2024-04-13 ENCOUNTER — Other Ambulatory Visit (HOSPITAL_COMMUNITY): Payer: Self-pay

## 2024-04-13 ENCOUNTER — Encounter (HOSPITAL_COMMUNITY): Payer: Self-pay

## 2024-04-13 MED ORDER — WEGOVY 1.7 MG/0.75ML ~~LOC~~ SOAJ
1.7000 mg | SUBCUTANEOUS | 0 refills | Status: AC
  Filled 2024-04-13: qty 3, 28d supply, fill #0

## 2024-06-09 ENCOUNTER — Other Ambulatory Visit: Payer: Self-pay | Admitting: Medical Genetics

## 2024-06-18 ENCOUNTER — Other Ambulatory Visit: Payer: Self-pay | Admitting: Obstetrics and Gynecology

## 2024-06-18 DIAGNOSIS — Z1231 Encounter for screening mammogram for malignant neoplasm of breast: Secondary | ICD-10-CM
